# Patient Record
Sex: Female | Born: 1995
Health system: Southern US, Community
[De-identification: ages and names within clinical notes are randomized; demographics above are authoritative.]

## PROBLEM LIST (undated history)

## (undated) DIAGNOSIS — I471 Supraventricular tachycardia, unspecified: Secondary | ICD-10-CM

---
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
## 2017-05-13 DIAGNOSIS — I471 Supraventricular tachycardia: Secondary | ICD-10-CM | POA: Diagnosis not present

## 2017-06-13 DIAGNOSIS — I471 Supraventricular tachycardia: Secondary | ICD-10-CM | POA: Diagnosis not present

## 2017-06-13 NOTE — H&P (Signed)
 [facility]  [id number], [address] [facility], [place] [address] Ph     [phone]  Fax   [phone] Appt [phone]   [facility] HISTORY AND PHYSICAL  06/13/2017   Erica Summers   DOB Sep 08, 1995 MRN [id number]  Referring Physician: Cordella Rea, MD  Primary Care Physician: Jenetta Holmes HERO, MD [address] / [place] [address]  Indication for procedure: PSVT  Problem List: Problem List  Date Reviewed: 05/17/2017         Noted   PSVT (paroxysmal supraventricular tachycardia) (CMS-HCC) 02/10/2016   Heart palpitations 12/26/2015                                                                                                                                                  History of present illness: The patient is a 22 y.o.  female who is here for EP study with potential ablation.  She had documented SVT by event monitor with somewhat frequent recurrences, especially over the last couple of months. She desires potential ablation. No recent fevers or viral illness.  Review of Systems: Except for that noted above, review of the following systems is negative: general, ENT, respiratory, cardiovascular, genitourinary, gastrointestinal, musculoskeletal, neurologic, endocrine, integument, developmental.  No current Epic-ordered outpatient medications on file.   No current Epic-ordered facility-administered medications on file.     No Known Allergies  PMHx/PSHx: 1. SVT 2. No prior surgeries  Family History  Problem Relation Age of Onset  . No Known Problems Mother   . No Known Problems Father   . No Known Problems Sister   . No Known Problems Brother    Apart from that noted above, there is no history of sudden death below the age of 62, congenital heart disease, SIDS, long QT syndrome, Brugada syndrome, other arrhythmia syndrome, cardiomyopathy, heart transplant, pacemaker or ICD implantation, early coronary artery  disease.   Social history: Erica Summers lives with her parents and attends [employer]. No history of tobacco use.   Physical examination:   BP 115/71 (BP Location: Right upper arm, Patient Position: Sitting, BP Cuff Size: Adult)   Pulse 115   Temp 36.9 C (98.4 F) (Oral)   Resp 18   Ht 170 cm (5' 6.93)   Wt 58.5 kg (128 lb 15.5 oz)   SpO2 96%   BMI 20.24 kg/m   Constitutional:  In general the patient is well-developed, well-nourished, alert, interactive, and developmentally-appropriate.   HEENT: Non-dysmorphic. The oropharynx is normal. Dentition is good. Mucous membranes are moist and acyanotic. Neck is supple without adenopathy.   Respiratory: The chest is normally formed and clear to auscultation bilaterally.   Cardiovascular: There is a regular rate and rhythm. The precordial impulse is normal in intensity and location. There are no lifts or heaves. There is a normal S1 and  S2. The second heart sound splits physiologically with respirations. There are no murmurs, rubs, or gallops. Diastole is quiet. There are 2+ pulses in the upper and lower extremities without delays.   GI: The abdomen is soft and flat without hepatosplenomegaly.   Extremities: Warm and well-perfused. There is no cyanosis, clubbing, or edema.   Skin: no cyanosis, rashes, or significant lesions.   Musculoskeletal: full strength, no deformities, symmetric.   Neuro: Symmetric, interactive, and developmentally appropriate. No overt deficits.  Laboratory Studies Reviewed or Ordered:  Electrocardiogram:  The most recent 12-lead electrocardiogram was reviewed which shows the presence of normal sinus rhythm.  There is no evidence for chamber enlargement or hypertrophy.  There is a normal QRS axis for age.  Repolarization appears normal with a normal QT interval.  Echocardiogram: The report of the the most recent echocardiogram was reviewed.   Diagnostic impressions:  1. Paroxysmal SVT  Erica Summers is  here for catheter electrophysiology study with option for ablation. Risks and benefits discussed. Consent in chart.  Plan:  Catheter electrophysiology study with option for ablation GETA with recovery in PACU Overnight observation       Zebulon Zachary Spector, MD Pediatric Cardiology and Electrophysiology [facility]  [facility] Phone: [phone]  Fax: [phone] Electrophysiology Offices Phone: [phone]  Fax: [phone] [facility] Phone: [phone]  Fax: [phone] [facility] Phone: [phone]  Fax: [phone] [facility] on call: [phone] or [phone]       [facility]  [id number], [address] [facility] [place], [place] [address] Ph     [phone]  Fax   [phone] Appt [phone]   [facility] IMPACT-MAP-IT PEDIATRIC ABLATION REGISTRY  PRE-ELECTROPHYSIOLOGY STUDY SYMPTOM SEVERITY SURVEY  06/13/2017 Erica Summers 21-Sep-1995 [id number]  Date of procedure: 06/13/2017  1. In the past six months, what symptom or feeling that comes from the patient's heart problem bothers him/her the most often? Palpitations   2.  If any symptoms have been present, how often has the patient had this feeling in the past six months (skip if no symptoms)? At least once per month  3. In the past six months, what symptom or feeling that comes from the patient's heart problem was the worst (most severe or unpleasant)? Palpitations   4. In the past six months, for any heart rhythm episodes the patient has had, what is the most intense (severe) treatment that the patient has endured to treat the heart problem? Self-resolving  5. In the past six months, has the patient taken any of the following medications ? (Check all that apply) None  6. In the past six months, does the patient feel that their rhythm problem has interfered with how well they are able to work, go to school, or  play? yes   7. Indicate who completed the Symptom Severity Survey Patient

## 2017-06-14 DIAGNOSIS — I471 Supraventricular tachycardia: Secondary | ICD-10-CM | POA: Diagnosis not present

## 2017-08-05 HISTORY — PX: CARDIAC ELECTROPHYSIOLOGY MAPPING AND ABLATION: SHX1292

## 2017-08-10 ENCOUNTER — Telehealth: Payer: Self-pay | Admitting: Family Medicine

## 2017-08-10 NOTE — Telephone Encounter (Unsigned)
Copied from CRM [id number]#[id number]. Topic: Quick Communication - See Telephone Encounter >> Aug 10, 2017  4:34 PM Raquel SarnaHayes, Teresa G wrote: Pt's mother called needing advise on how to help pt.  Pt is now in college and is struggling w/ attention issues and is struggling with grades.  Please call mother back to discuss care and next steps.

## 2017-08-11 NOTE — Telephone Encounter (Signed)
Called mother.. No answer. Let a message for her to call back office  Please call her tommorow.. Let her know if ADD issues.. She can make appt here. We can refer for ADD testing  In [place]/ OR she can look into ADD testin on  [employer] [facility] then bring the info to me after testing completed and we can talk about a treatment plan.  Created CRM

## 2017-08-12 NOTE — Telephone Encounter (Signed)
Left message for Erica Summers to return call.  [facility] with mom to give recommendations from Dr. Ermalene SearingBedsole.

## 2017-09-02 ENCOUNTER — Other Ambulatory Visit: Payer: Self-pay

## 2017-09-02 ENCOUNTER — Encounter: Payer: Self-pay | Admitting: Family Medicine

## 2017-09-02 ENCOUNTER — Ambulatory Visit (INDEPENDENT_AMBULATORY_CARE_PROVIDER_SITE_OTHER): Payer: BLUE CROSS/BLUE SHIELD | Admitting: Family Medicine

## 2017-09-02 DIAGNOSIS — N926 Irregular menstruation, unspecified: Secondary | ICD-10-CM | POA: Diagnosis not present

## 2017-09-02 DIAGNOSIS — R109 Unspecified abdominal pain: Secondary | ICD-10-CM | POA: Diagnosis not present

## 2017-09-02 DIAGNOSIS — R41840 Attention and concentration deficit: Secondary | ICD-10-CM | POA: Diagnosis not present

## 2017-09-02 LAB — POC URINALSYSI DIPSTICK (AUTOMATED)
BILIRUBIN UA: NEGATIVE
Blood, UA: NEGATIVE
GLUCOSE UA: NEGATIVE
KETONES UA: NEGATIVE
Leukocytes, UA: NEGATIVE
Nitrite, UA: NEGATIVE
Protein, UA: NEGATIVE
Spec Grav, UA: 1.025 (ref 1.010–1.025)
Urobilinogen, UA: 0.2 E.U./dL
pH, UA: 6 (ref 5.0–8.0)

## 2017-09-02 LAB — POCT URINE PREGNANCY: Preg Test, Ur: POSITIVE — AB

## 2017-09-02 NOTE — Progress Notes (Signed)
   Subjective:    Patient ID: Erica Summers, female    DOB: 09-13-1995, 22 y.o.   MRN: [id number]  HPI  22 year old female presents for trouble concentrating. She has not noted in highschool, course work was easier.. Now since in college, she has struggled concentrating, more fidgity.  Notes at home as well as at school.  No new meds.  Has never been  Tested for add.  When she was a child was fidgity per parents.   She feels she many be more anxious.. She feels overwhelmed moving out.   PHQ9 7 GAD7: 6   PSVT: treated with ablation 06/2017.Marland Kitchen. No problems since.    Has noted a heavy feeling in stomach. She has noted increase in irregularity of menses. Ongoing x 3 months.  Menses occur once a month, but not on same day.. Off by a few days.  Lasts 6 days, not heavy. No dysuria. Occ cramping with BM and urinating. Relieves symptoms some what.  Occ diarrhea and constipation.  She is sexually active. No blood in urine or stool.   She feels like diet in last year, lighter meals, eating more junk.   Review of Systems  Constitutional: Negative for fatigue and fever.  HENT: Negative for ear pain.   Eyes: Negative for pain.  Respiratory: Negative for chest tightness and shortness of breath.   Cardiovascular: Negative for chest pain, palpitations and leg swelling.  Gastrointestinal: Negative for abdominal pain.  Genitourinary: Negative for dysuria.       Objective:   Physical Exam  Constitutional: Vital signs are normal. She appears well-developed and well-nourished. She is cooperative.  Non-toxic appearance. She does not appear ill. No distress.  HENT:  Head: Normocephalic.  Right Ear: Hearing, tympanic membrane, external ear and ear canal normal. Tympanic membrane is not erythematous, not retracted and not bulging.  Left Ear: Hearing, tympanic membrane, external ear and ear canal normal. Tympanic membrane is not erythematous, not retracted and not bulging.  Nose: No mucosal  edema or rhinorrhea. Right sinus exhibits no maxillary sinus tenderness and no frontal sinus tenderness. Left sinus exhibits no maxillary sinus tenderness and no frontal sinus tenderness.  Mouth/Throat: Uvula is midline, oropharynx is clear and moist and mucous membranes are normal.  Eyes: Pupils are equal, round, and reactive to light. Conjunctivae, EOM and lids are normal. Lids are everted and swept, no foreign bodies found.  Neck: Trachea normal and normal range of motion. Neck supple. Carotid bruit is not present. No thyroid mass and no thyromegaly present.  Cardiovascular: Normal rate, regular rhythm, S1 normal, S2 normal, normal heart sounds, intact distal pulses and normal pulses. Exam reveals no gallop and no friction rub.  No murmur heard. Pulmonary/Chest: Effort normal and breath sounds normal. No tachypnea. No respiratory distress. She has no decreased breath sounds. She has no wheezes. She has no rhonchi. She has no rales.  Abdominal: Soft. Normal appearance and bowel sounds are normal. There is no hepatosplenomegaly. There is tenderness in the right lower quadrant. There is no rigidity, no rebound, no guarding and no CVA tenderness.  Neurological: She is alert.  Skin: Skin is warm, dry and intact. No rash noted.  Psychiatric: Her speech is normal and behavior is normal. Judgment and thought content normal. Her mood appears not anxious. Cognition and memory are normal. She does not exhibit a depressed mood.          Assessment & Plan:

## 2017-09-02 NOTE — Assessment & Plan Note (Signed)
Refer for ADD testing. Possible GAD, mild involved.  Hesitate to use stimulant given PSVT history.. Would need to clear with cardiology.

## 2017-09-02 NOTE — Patient Instructions (Signed)
Please stop at the front desk to set up referral.  

## 2017-09-02 NOTE — Assessment & Plan Note (Signed)
Pt with positive pregnancy test. She is in shock and does not wish to have any further confirmation. Does not know what she will do. I will contact her next week to  Check on her and  To see if she has determined her plan or needs assistance.

## 2017-09-15 DIAGNOSIS — I471 Supraventricular tachycardia: Secondary | ICD-10-CM | POA: Diagnosis not present

## 2017-09-15 DIAGNOSIS — Z9889 Other specified postprocedural states: Secondary | ICD-10-CM | POA: Diagnosis not present

## 2017-09-15 DIAGNOSIS — Z8679 Personal history of other diseases of the circulatory system: Secondary | ICD-10-CM | POA: Diagnosis not present

## 2017-11-01 ENCOUNTER — Telehealth: Payer: Self-pay | Admitting: Family Medicine

## 2017-11-01 NOTE — Telephone Encounter (Signed)
Please advise, pt request to transfer from [facility] to Dr. Dayton Martes. Both providers ok to transfer?     Copied from CRM [id number]. Topic: Appointment Scheduling - Scheduling Inquiry for Clinic >> Nov 01, 2017 12:27 PM Leafy Ro wrote: Reason for CRM: pt would like to switch to dr Dayton Martes

## 2017-11-01 NOTE — Telephone Encounter (Signed)
FYI: Last OV pt found to be pregnant. She did not follow up  To tell us her plans, not sure what happened.  Not sure why transfer but no clear red flags.  She is a very nice girl.

## 2017-11-02 ENCOUNTER — Emergency Department: Payer: BLUE CROSS/BLUE SHIELD

## 2017-11-02 ENCOUNTER — Emergency Department
Admission: EM | Admit: 2017-11-02 | Discharge: 2017-11-02 | Disposition: A | Discharge status: Home / Self Care | Payer: BLUE CROSS/BLUE SHIELD | Source: Home / Self Care | Attending: Emergency Medicine | Admitting: Emergency Medicine

## 2017-11-02 ENCOUNTER — Inpatient Hospital Stay
Admission: RE | Admit: 2017-11-02 | Discharge: 2017-11-13 | DRG: 787 | Disposition: A | Discharge status: Home / Self Care | Payer: BLUE CROSS/BLUE SHIELD | Attending: Obstetrics and Gynecology | Admitting: Obstetrics and Gynecology

## 2017-11-02 ENCOUNTER — Encounter: Payer: Self-pay | Admitting: Emergency Medicine

## 2017-11-02 ENCOUNTER — Other Ambulatory Visit: Payer: Self-pay

## 2017-11-02 ENCOUNTER — Encounter: Payer: Self-pay | Admitting: *Deleted

## 2017-11-02 DIAGNOSIS — O0930 Supervision of pregnancy with insufficient antenatal care, unspecified trimester: Secondary | ICD-10-CM

## 2017-11-02 DIAGNOSIS — O1493 Unspecified pre-eclampsia, third trimester: Secondary | ICD-10-CM | POA: Diagnosis not present

## 2017-11-02 DIAGNOSIS — O1494 Unspecified pre-eclampsia, complicating childbirth: Secondary | ICD-10-CM | POA: Diagnosis not present

## 2017-11-02 DIAGNOSIS — O9081 Anemia of the puerperium: Secondary | ICD-10-CM | POA: Diagnosis not present

## 2017-11-02 DIAGNOSIS — E039 Hypothyroidism, unspecified: Secondary | ICD-10-CM | POA: Diagnosis present

## 2017-11-02 DIAGNOSIS — R188 Other ascites: Secondary | ICD-10-CM | POA: Diagnosis not present

## 2017-11-02 DIAGNOSIS — O99824 Streptococcus B carrier state complicating childbirth: Secondary | ICD-10-CM | POA: Diagnosis not present

## 2017-11-02 DIAGNOSIS — Z3A31 31 weeks gestation of pregnancy: Secondary | ICD-10-CM

## 2017-11-02 DIAGNOSIS — O1414 Severe pre-eclampsia complicating childbirth: Secondary | ICD-10-CM | POA: Diagnosis not present

## 2017-11-02 DIAGNOSIS — Z3A29 29 weeks gestation of pregnancy: Secondary | ICD-10-CM | POA: Diagnosis not present

## 2017-11-02 DIAGNOSIS — O1490 Unspecified pre-eclampsia, unspecified trimester: Secondary | ICD-10-CM | POA: Diagnosis present

## 2017-11-02 DIAGNOSIS — I10 Essential (primary) hypertension: Secondary | ICD-10-CM

## 2017-11-02 DIAGNOSIS — O0933 Supervision of pregnancy with insufficient antenatal care, third trimester: Secondary | ICD-10-CM | POA: Diagnosis not present

## 2017-11-02 DIAGNOSIS — O163 Unspecified maternal hypertension, third trimester: Secondary | ICD-10-CM | POA: Diagnosis present

## 2017-11-02 DIAGNOSIS — R600 Localized edema: Secondary | ICD-10-CM

## 2017-11-02 DIAGNOSIS — Z3689 Encounter for other specified antenatal screening: Secondary | ICD-10-CM | POA: Diagnosis not present

## 2017-11-02 DIAGNOSIS — R7989 Other specified abnormal findings of blood chemistry: Secondary | ICD-10-CM | POA: Diagnosis not present

## 2017-11-02 DIAGNOSIS — O321XX0 Maternal care for breech presentation, not applicable or unspecified: Secondary | ICD-10-CM | POA: Diagnosis not present

## 2017-11-02 DIAGNOSIS — R609 Edema, unspecified: Secondary | ICD-10-CM

## 2017-11-02 DIAGNOSIS — O99284 Endocrine, nutritional and metabolic diseases complicating childbirth: Secondary | ICD-10-CM | POA: Diagnosis present

## 2017-11-02 DIAGNOSIS — Z3493 Encounter for supervision of normal pregnancy, unspecified, third trimester: Secondary | ICD-10-CM

## 2017-11-02 DIAGNOSIS — R0602 Shortness of breath: Secondary | ICD-10-CM | POA: Insufficient documentation

## 2017-11-02 DIAGNOSIS — R030 Elevated blood-pressure reading, without diagnosis of hypertension: Secondary | ICD-10-CM | POA: Diagnosis not present

## 2017-11-02 DIAGNOSIS — O26893 Other specified pregnancy related conditions, third trimester: Secondary | ICD-10-CM | POA: Diagnosis not present

## 2017-11-02 DIAGNOSIS — O43813 Placental infarction, third trimester: Secondary | ICD-10-CM | POA: Diagnosis not present

## 2017-11-02 DIAGNOSIS — R0600 Dyspnea, unspecified: Secondary | ICD-10-CM

## 2017-11-02 DIAGNOSIS — O26853 Spotting complicating pregnancy, third trimester: Secondary | ICD-10-CM | POA: Diagnosis not present

## 2017-11-02 DIAGNOSIS — O1413 Severe pre-eclampsia, third trimester: Secondary | ICD-10-CM | POA: Diagnosis not present

## 2017-11-02 DIAGNOSIS — Z3A32 32 weeks gestation of pregnancy: Secondary | ICD-10-CM | POA: Diagnosis not present

## 2017-11-02 DIAGNOSIS — Z3A30 30 weeks gestation of pregnancy: Secondary | ICD-10-CM | POA: Diagnosis not present

## 2017-11-02 DIAGNOSIS — O133 Gestational [pregnancy-induced] hypertension without significant proteinuria, third trimester: Secondary | ICD-10-CM | POA: Diagnosis not present

## 2017-11-02 HISTORY — DX: Supraventricular tachycardia: I47.1

## 2017-11-02 HISTORY — DX: Supraventricular tachycardia, unspecified: I47.10

## 2017-11-02 LAB — CBC
HEMATOCRIT: 37.4 % (ref 35.0–47.0)
HEMOGLOBIN: 12.7 g/dL (ref 12.0–16.0)
MCH: 30.6 pg (ref 26.0–34.0)
MCHC: 34.1 g/dL (ref 32.0–36.0)
MCV: 89.9 fL (ref 80.0–100.0)
Platelets: 234 10*3/uL (ref 150–440)
RBC: 4.16 MIL/uL (ref 3.80–5.20)
RDW: 14.6 % — AB (ref 11.5–14.5)
WBC: 8.6 10*3/uL (ref 3.6–11.0)

## 2017-11-02 LAB — TROPONIN I
TROPONIN I: 0.03 ng/mL — AB (ref <0.03)
Troponin I: 0.03 ng/mL (ref <0.03)

## 2017-11-02 LAB — URINALYSIS, COMPLETE (UACMP) WITH MICROSCOPIC
BILIRUBIN URINE: NEGATIVE
Glucose, UA: NEGATIVE mg/dL
HGB URINE DIPSTICK: NEGATIVE
Ketones, ur: NEGATIVE mg/dL
LEUKOCYTES UA: NEGATIVE
Nitrite: NEGATIVE
Protein, ur: 100 mg/dL — AB
SPECIFIC GRAVITY, URINE: 1.019 (ref 1.005–1.030)
pH: 5 (ref 5.0–8.0)

## 2017-11-02 LAB — COMPREHENSIVE METABOLIC PANEL
ALK PHOS: 143 U/L — AB (ref 38–126)
ALT: 15 U/L (ref 14–54)
ANION GAP: 10 (ref 5–15)
AST: 36 U/L (ref 15–41)
Albumin: 2.3 g/dL — ABNORMAL LOW (ref 3.5–5.0)
BILIRUBIN TOTAL: 0.4 mg/dL (ref 0.3–1.2)
BUN: 28 mg/dL — ABNORMAL HIGH (ref 6–20)
CALCIUM: 8.6 mg/dL — AB (ref 8.9–10.3)
CO2: 19 mmol/L — ABNORMAL LOW (ref 22–32)
Chloride: 106 mmol/L (ref 101–111)
Creatinine, Ser: 0.95 mg/dL (ref 0.44–1.00)
GFR calc Af Amer: >60 mL/min (ref >60)
GFR calc non Af Amer: >60 mL/min (ref >60)
GLUCOSE: 78 mg/dL (ref 65–99)
POTASSIUM: 4.3 mmol/L (ref 3.5–5.1)
Sodium: 135 mmol/L (ref 135–145)
TOTAL PROTEIN: 5.9 g/dL — AB (ref 6.5–8.1)

## 2017-11-02 LAB — LIPASE, BLOOD: LIPASE: 37 U/L (ref 11–51)

## 2017-11-02 LAB — TSH: TSH: 5.22 u[IU]/mL — ABNORMAL HIGH (ref 0.350–4.500)

## 2017-11-02 LAB — POC URINE PREG, ED: Preg Test, Ur: POSITIVE — AB

## 2017-11-02 LAB — RAPID HIV SCREEN (HIV 1/2 AB+AG)
HIV 1/2 Antibodies: NONREACTIVE
HIV-1 P24 Antigen - HIV24: NONREACTIVE

## 2017-11-02 LAB — HCG, QUANTITATIVE, PREGNANCY: hCG, Beta Chain, Quant, S: 35063 m[IU]/mL — ABNORMAL HIGH (ref <5)

## 2017-11-02 LAB — ABO/RH: ABO/RH(D): O POS

## 2017-11-02 LAB — BRAIN NATRIURETIC PEPTIDE: B Natriuretic Peptide: 252 pg/mL — ABNORMAL HIGH (ref 0.0–100.0)

## 2017-11-02 MED ORDER — CALCIUM CARBONATE ANTACID 500 MG PO CHEW
2.0000 | CHEWABLE_TABLET | ORAL | Status: DC | PRN
  Administered 2017-11-08 – 2017-11-09 (×3): 400 mg via ORAL
  Filled 2017-11-02 (×4): qty 2

## 2017-11-02 MED ORDER — HYDRALAZINE HCL 20 MG/ML IJ SOLN
10.0000 mg | Freq: Once | INTRAMUSCULAR | Status: AC | PRN
  Administered 2017-11-07: 10 mg via INTRAVENOUS
  Filled 2017-11-02: qty 1

## 2017-11-02 MED ORDER — PRENATAL MULTIVITAMIN CH
1.0000 | ORAL_TABLET | Freq: Every day | ORAL | Status: DC
  Administered 2017-11-03 – 2017-11-09 (×6): 1 via ORAL
  Filled 2017-11-02 (×6): qty 1

## 2017-11-02 MED ORDER — MAGNESIUM SULFATE 4 GM/100ML IV SOLN
INTRAVENOUS | Status: AC
  Administered 2017-11-02: 4 g
  Filled 2017-11-02: qty 100

## 2017-11-02 MED ORDER — DOCUSATE SODIUM 100 MG PO CAPS
100.0000 mg | ORAL_CAPSULE | Freq: Every day | ORAL | Status: DC
  Administered 2017-11-03 – 2017-11-09 (×6): 100 mg via ORAL
  Filled 2017-11-02 (×6): qty 1

## 2017-11-02 MED ORDER — CALCIUM GLUCONATE 10 % IV SOLN
INTRAVENOUS | Status: AC
  Filled 2017-11-02: qty 10

## 2017-11-02 MED ORDER — LACTATED RINGERS IV SOLN
INTRAVENOUS | Status: DC
  Administered 2017-11-02 – 2017-11-04 (×4): via INTRAVENOUS
  Administered 2017-11-04: 1000 mL via INTRAVENOUS

## 2017-11-02 MED ORDER — ACETAMINOPHEN 325 MG PO TABS
650.0000 mg | ORAL_TABLET | ORAL | Status: DC | PRN
  Administered 2017-11-03 – 2017-11-08 (×4): 650 mg via ORAL
  Filled 2017-11-02 (×4): qty 2

## 2017-11-02 MED ORDER — LABETALOL HCL 5 MG/ML IV SOLN
20.0000 mg | INTRAVENOUS | Status: AC | PRN
  Administered 2017-11-09: 40 mg via INTRAVENOUS
  Administered 2017-11-09: 20 mg via INTRAVENOUS
  Administered 2017-11-09: 80 mg via INTRAVENOUS
  Filled 2017-11-02: qty 8
  Filled 2017-11-02: qty 4
  Filled 2017-11-02 (×2): qty 16

## 2017-11-02 MED ORDER — ZOLPIDEM TARTRATE 5 MG PO TABS
5.0000 mg | ORAL_TABLET | Freq: Every evening | ORAL | Status: DC | PRN
  Administered 2017-11-03: 5 mg via ORAL
  Filled 2017-11-02: qty 1

## 2017-11-02 MED ORDER — LABETALOL HCL 5 MG/ML IV SOLN
INTRAVENOUS | Status: AC
  Filled 2017-11-02: qty 4

## 2017-11-02 MED ORDER — MAGNESIUM SULFATE 40 G IN LACTATED RINGERS - SIMPLE
INTRAVENOUS | Status: AC
  Administered 2017-11-02: 2 g/h via INTRAVENOUS
  Filled 2017-11-02: qty 500

## 2017-11-02 MED ORDER — BETAMETHASONE SOD PHOS & ACET 6 (3-3) MG/ML IJ SUSP
INTRAMUSCULAR | Status: AC
  Administered 2017-11-02: 12 mg via INTRAMUSCULAR
  Filled 2017-11-02: qty 1

## 2017-11-02 MED ORDER — MAGNESIUM SULFATE 40 G IN LACTATED RINGERS - SIMPLE
2.0000 g/h | INTRAVENOUS | Status: AC
  Administered 2017-11-02 – 2017-11-04 (×3): 2 g/h via INTRAVENOUS
  Filled 2017-11-02 (×3): qty 500

## 2017-11-02 MED ORDER — LABETALOL HCL 100 MG PO TABS
100.0000 mg | ORAL_TABLET | Freq: Once | ORAL | Status: AC
  Administered 2017-11-02: 100 mg via ORAL
  Filled 2017-11-02: qty 1

## 2017-11-02 MED ORDER — BETAMETHASONE SOD PHOS & ACET 6 (3-3) MG/ML IJ SUSP
12.0000 mg | INTRAMUSCULAR | Status: AC
  Administered 2017-11-02 – 2017-11-03 (×2): 12 mg via INTRAMUSCULAR

## 2017-11-02 MED ORDER — MAGNESIUM SULFATE BOLUS VIA INFUSION
4.0000 g | Freq: Once | INTRAVENOUS | Status: DC
  Filled 2017-11-02: qty 500

## 2017-11-02 NOTE — Telephone Encounter (Signed)
Yes okay with me and thank you for the FYI.

## 2017-11-02 NOTE — ED Notes (Signed)
Pt back from X-ray.  

## 2017-11-02 NOTE — ED Notes (Signed)
Patient transported to Ultrasound 

## 2017-11-02 NOTE — ED Triage Notes (Signed)
Pt to ED from home c/o bilateral leg swelling started 1 week ago and getting worse, states SOB worse with sitting and lying down, no change with exertion.  Denies injury ior heavy activity, denes pain at this time.  States had ablation done at [facility] in January for SVT.  (+) pedal pulses bilaterally, no pitting with edema.

## 2017-11-02 NOTE — ED Notes (Signed)
Patient transported to X-ray 

## 2017-11-02 NOTE — ED Notes (Addendum)
Patient transported to XRAY 

## 2017-11-02 NOTE — Telephone Encounter (Signed)
Please help call and schedule transfer pt with Dr. Dayton Martes.   Thank you

## 2017-11-02 NOTE — H&P (Signed)
Obstetric History and Physical  Erica Summers is a 22 y.o. G1P0 with IUP at [redacted]w[redacted]d (by today's limited ultrasound) presenting to the Emergency for complaints of leg swelling.  Of note, patient has had no prenatal care. Notes that she did not know that she was pregnant until diagnosed in the Emergency Room. She states that the leg swelling has been ongoing for~ 1 week, but was significantly worse today. She denies SOB, chest pain, nausea/vomiting, RUQ pain, vision changes.  Blood pressures were noted to be elevated in the ER, 140s-150s/100s-110s.   Patient denies contractions, no vaginal bleeding, intact membranes, with no history of ever noting fetal movement.    Prenatal Course Source of Care: None Pregnancy complications or risks: Patient Active Problem List   Diagnosis Date Noted  . No prenatal care in current pregnancy 11/02/2017  . Pre-eclampsia affecting pregnancy, antepartum 11/02/2017  . Decreased attention Span 09/02/2017  . Abdominal cramping 09/02/2017  . Irregular menses 09/02/2017  . Fatigue 06/08/2016  . Screen for sexually transmitted diseases 06/08/2016  . PSVT (paroxysmal supraventricular tachycardia) (HCC) 02/10/2016  . Palpitations 08/11/2015  . DUB (dysfunctional uterine bleeding) 02/18/2015    Prenatal labs and studies: ABO, Rh: --/--/O POS Performed at [facility], [address] [place], [place] [address]  [phone]/29 [phone]) Antibody:   Rubella:   RPR:    HBsAg:    HIV:    GBS:  1 hr Glucola:    Past Medical History:  Diagnosis Date  . SVT (supraventricular tachycardia) (HCC)     Past Surgical History:  Procedure Laterality Date  . CARDIAC ELECTROPHYSIOLOGY MAPPING AND ABLATION  08/2017  . WISDOM TOOTH EXTRACTION  2016    OB History  Gravida Para Term Preterm AB Living  1            SAB TAB Ectopic Multiple Live Births               # Outcome Date GA Lbr Len/2nd Weight Sex Delivery Anes PTL Lv  1 Current             Social  History   Socioeconomic History  . Marital status: Single    Spouse name: Not on file  . Number of children: Not on file  . Years of education: Not on file  . Highest education level: Not on file  Occupational History  . Not on file  Social Needs  . Financial resource strain: Not on file  . Food insecurity:    Worry: Not on file    Inability: Not on file  . Transportation needs:    Medical: Not on file    Non-medical: Not on file  Tobacco Use  . Smoking status: Never Smoker  . Smokeless tobacco: Never Used  Substance and Sexual Activity  . Alcohol use: No  . Drug use: No  . Sexual activity: Yes    Birth control/protection: None  Lifestyle  . Physical activity:    Days per week: Not on file    Minutes per session: Not on file  . Stress: Not on file  Relationships  . Social connections:    Talks on phone: Not on file    Gets together: Not on file    Attends religious service: Not on file    Active member of club or organization: Not on file    Attends meetings of clubs or organizations: Not on file    Relationship status: Not on file  Other Topics Concern  .  Not on file  Social History Narrative   Going to the 6 th grade [employer]      A and B in school      Plays soccer      Goes to [employer] a lot      Unsure of career plans      Plays outside with running, bike, and swimming    History reviewed. No pertinent family history.  Medications Prior to Admission  Medication Sig Dispense Refill Last Dose  . Multiple Vitamin (MULTIVITAMIN WITH MINERALS) TABS tablet Take 1 tablet by mouth daily.       No Known Allergies  Review of Systems: Negative except for what is mentioned in HPI.  Physical Exam: Blood pressure (!) 155/116, pulse 88, temperature 98.5 F (36.9 C), temperature source Oral, resp. rate 18, height 5' 7.5" (1.715 m), weight 136 lb (61.7 kg), last menstrual period 08/22/2017.  CONSTITUTIONAL: Well-developed,  well-nourished female in no acute distress.  HENT:  Normocephalic, atraumatic, External right and left ear normal. Oropharynx is clear and moist EYES: Conjunctivae and EOM are normal. Pupils are equal, round, and reactive to light. No scleral icterus.  NECK: Normal range of motion, supple, no masses SKIN: Skin is warm and dry. No rash noted. Not diaphoretic. No erythema. No pallor. NEUROLOGIC: Alert and oriented to person, place, and time. Normal reflexes, muscle tone coordination. No cranial nerve deficit noted. +3 clonus. DTR weak.  PSYCHIATRIC: Normal mood and affect. Normal behavior. Normal judgment and thought content. CARDIOVASCULAR: Normal heart rate noted, regular rhythm.  +3 pitting edema in ankles, +1 pitting edema in legs up to mid-calf.  RESPIRATORY: Effort and breath sounds normal, no problems with respiration noted ABDOMEN: Soft, nontender, nondistended, gravid. MUSCULOSKELETAL: Normal range of motion.  2+ distal pulses.    Cervical Exam: Deferred.  Presentation: breech (based on today's limited ultrasound) FHT:  Baseline rate 130 bpm   Variability moderate  Accelerations present   Decelerations late (x2, from baseline 130s to 90s) Contractions: Irregular, q 3-8 mins (undetectable by patient)   Pertinent Labs/Studies:   Results for orders placed or performed during the hospital encounter of 11/02/17 (from the past 24 hour(s))  POC Urine Pregnancy, ED     Status: Abnormal   Collection Time: 11/02/17  2:20 PM  Result Value Ref Range   Preg Test, Ur Positive (A) Negative  hCG, quantitative, pregnancy     Status: Abnormal   Collection Time: 11/02/17  5:05 PM  Result Value Ref Range   hCG, Beta Chain, Quant, S 35,063 (H) <5 mIU/mL  CBC     Status: Abnormal   Collection Time: 11/02/17  5:06 PM  Result Value Ref Range   WBC 8.6 3.6 - 11.0 K/uL   RBC 4.16 3.80 - 5.20 MIL/uL   Hemoglobin 12.7 12.0 - 16.0 g/dL   HCT 16.1 09.6 - 04.5 %   MCV 89.9 80.0 - 100.0 fL   MCH 30.6 26.0  - 34.0 pg   MCHC 34.1 32.0 - 36.0 g/dL   RDW 40.9 (H) 81.1 - 91.4 %   Platelets 234 150 - 440 K/uL  Comprehensive metabolic panel     Status: Abnormal   Collection Time: 11/02/17  5:06 PM  Result Value Ref Range   Sodium 135 135 - 145 mmol/L   Potassium 4.3 3.5 - 5.1 mmol/L   Chloride 106 101 - 111 mmol/L   CO2 19 (L) 22 - 32 mmol/L   Glucose, Bld 78 65 -  99 mg/dL   BUN 28 (H) 6 - 20 mg/dL   Creatinine, Ser 1.61 0.44 - 1.00 mg/dL   Calcium 8.6 (L) 8.9 - 10.3 mg/dL   Total Protein 5.9 (L) 6.5 - 8.1 g/dL   Albumin 2.3 (L) 3.5 - 5.0 g/dL   AST 36 15 - 41 U/L   ALT 15 14 - 54 U/L   Alkaline Phosphatase 143 (H) 38 - 126 U/L   Total Bilirubin 0.4 0.3 - 1.2 mg/dL   GFR calc non Af Amer >60 >60 mL/min   GFR calc Af Amer >60 >60 mL/min   Anion gap 10 5 - 15  Lipase, blood     Status: None   Collection Time: 11/02/17  5:06 PM  Result Value Ref Range   Lipase 37 11 - 51 U/L  Brain natriuretic peptide     Status: Abnormal   Collection Time: 11/02/17  5:06 PM  Result Value Ref Range   B Natriuretic Peptide 252.0 (H) 0.0 - 100.0 pg/mL  Troponin I     Status: Abnormal   Collection Time: 11/02/17  5:06 PM  Result Value Ref Range   Troponin I 0.03 (HH) <0.03 ng/mL  ABO/Rh     Status: None   Collection Time: 11/02/17  7:27 PM  Result Value Ref Range   ABO/RH(D)      O POS Performed at [facility], [address] [place], [place] [address]   Urinalysis, Complete w Microscopic     Status: Abnormal   Collection Time: 11/02/17  8:47 PM  Result Value Ref Range   Color, Urine YELLOW (A) YELLOW   APPearance CLOUDY (A) CLEAR   Specific Gravity, Urine 1.019 1.005 - 1.030   pH 5.0 5.0 - 8.0   Glucose, UA NEGATIVE NEGATIVE mg/dL   Hgb urine dipstick NEGATIVE NEGATIVE   Bilirubin Urine NEGATIVE NEGATIVE   Ketones, ur NEGATIVE NEGATIVE mg/dL   Protein, ur 540 (A) NEGATIVE mg/dL   Nitrite NEGATIVE NEGATIVE   Leukocytes, UA NEGATIVE NEGATIVE   RBC / HPF 0-5 0 - 5 RBC/hpf   WBC,  UA 0-5 0 - 5 WBC/hpf   Bacteria, UA RARE (A) NONE SEEN   Squamous Epithelial / LPF 0-5 0 - 5   Mucus PRESENT    Hyaline Casts, UA PRESENT     Imaging:  US OB Limited CLINICAL DATA:  Bilateral foot swelling.  Spotting.  EXAM: LIMITED OBSTETRIC ULTRASOUND  FINDINGS: Number of Fetuses: 1  Heart Rate:  140 bpm  Movement: Yes  Presentation: Breech  Placental Location: Posterior  Previa: No  Amniotic Fluid (Subjective):  Within normal limits.  BPD: 7.9 cm 31 w  6 d  MATERNAL FINDINGS:  Cervix:  Appears closed.  Uterus/Adnexae: No abnormality visualized.  IMPRESSION: Single live IUP.  This exam is performed on an emergent basis and does not comprehensively evaluate fetal size, dating, or anatomy; follow-up complete OB US should be considered if further fetal assessment is warranted.  Electronically Signed   By: Gerome Sam III M.D   On: 11/02/2017 20:53 DG Chest 2 View CLINICAL DATA:  Short of breath bilateral leg swelling for 1 week  EXAM: CHEST - 2 VIEW  COMPARISON:  None.  FINDINGS: Normal heart size. Lungs clear. No pneumothorax. Small bilateral pleural effusions.  IMPRESSION: Small bilateral pleural effusions.  Electronically Signed   By: Jolaine Click M.D.   On: 11/02/2017 18:28 US Venous Img Lower Bilateral CLINICAL DATA:  Bilateral lower extremity edema for 1  week  EXAM: BILATERAL LOWER EXTREMITY VENOUS DUPLEX ULTRASOUND  TECHNIQUE: Doppler venous assessment of the bilateral lower extremity deep venous system was performed, including characterization of spectral flow, compressibility, and phasicity.  COMPARISON:  None.  FINDINGS: There is complete compressibility of the bilateral common femoral, femoral, and popliteal veins. Doppler analysis demonstrates respiratory phasicity and augmentation of flow with calf compression within the common femoral and femoral veins. Respiratory phasicity and augmentation is demonstrated in the  right popliteal vein. There is augmentation noted in the left popliteal vein but phasicity is somewhat diminished of unknown significance. No obvious superficial vein or calf vein thrombosis.  IMPRESSION: No evidence of DVT in the lower extremities.  Electronically Signed   By: Jolaine Click M.D.   On: 11/02/2017 15:06   Assessment : ADDILYN SATTERWHITE is a 22 y.o. G1P0 at [redacted]w[redacted]d being admitted for elevated blood pressures, rule out pre-eclampsia, no prenatal care, elevated troponin, Category II fetal tracing, breech presentation.    Plan: 1. Admit to labor and Delivery  2. Patient initiated on PO Labetalol in ER. Given dose of IV Labetolol on L&D, and started on magnesium sulfate.  Pending protein/creatinine ratio to determine pre-eclampsia vs gestational HTN.  Platelets and liver enzymes wnl. Patient currently asymptomatic. Will discontinue magnesium sulfate if patient does not have pre-eclampsia. Continue with IV anti-hypertensives for severe range BPs.  3. Administer course of antenatal steroids 4. OB labs ordered as patient has not received any prenatal care.  5. Elevated troponin (borderline), also with elevated BNP. Patient asymptomatic (no chest pain, SOB), will repeat in 6 hrs. CXR notes small bilateral pleural effusions but no evidence of cardiomyopathy.  6. Category II tracing, patient turned on left side, facemask O2 as needed, with resolution of decelerations. Continues fetal monitoring and tocometry.  Patient currently not feeling contractions. Will defer cervical check at this time.  7. Ordered complete anatomy scan.  8. Patient with edema of legs, if no pre-eclampsia, can treat with 1 time dose of Lasix.      Hildred Laser, MD [facility]

## 2017-11-02 NOTE — ED Notes (Signed)
Date and time results received: 11/02/17 1850 (use smartphrase ".now" to insert current time)  Test: troponin Critical Value: 0.03  Name of Provider Notified: Quale  Orders Received? Or Actions Taken?: Orders Received - See Orders for details

## 2017-11-02 NOTE — ED Provider Notes (Signed)
[facility] Emergency Department Provider Note   ____________________________________________   First MD Initiated Contact with Patient 11/02/17 1644     (approximate)  I have reviewed the triage vital signs and the nursing notes.   HISTORY  Chief Complaint Leg Swelling    HPI Erica Summers is a 22 y.o. female ports no notable medical history except for an ablation in January at [facility] for SVT.  She is done well afterwards.  Patient reports that she is noticed quite a bit of swelling from her knees down over the last week slowly progressing.  She also had some slight shortness of breath when she lays down at night.  No fevers or chills.  No nausea vomiting.  Denies abdominal pain.  No chest pain or trouble breathing.  No history of any blood clots.  Does not take any medications.  Not allergic to any medications.   Past Medical History:  Diagnosis Date  . SVT (supraventricular tachycardia) [facility])     Patient Active Problem List   Diagnosis Date Noted  . No prenatal care in current pregnancy 11/02/2017  . Pre-eclampsia affecting pregnancy, antepartum 11/02/2017  . Elevated blood pressure affecting pregnancy in third trimester, antepartum 11/02/2017  . Decreased attention Span 09/02/2017  . Abdominal cramping 09/02/2017  . Irregular menses 09/02/2017  . Fatigue 06/08/2016  . Screen for sexually transmitted diseases 06/08/2016  . PSVT (paroxysmal supraventricular tachycardia) (HCC) 02/10/2016  . Palpitations 08/11/2015  . DUB (dysfunctional uterine bleeding) 02/18/2015    Past Surgical History:  Procedure Laterality Date  . CARDIAC ELECTROPHYSIOLOGY MAPPING AND ABLATION  08/2017  . WISDOM TOOTH EXTRACTION  2016    Prior to Admission medications   Medication Sig Start Date End Date Taking? Authorizing Provider  Multiple Vitamin (MULTIVITAMIN WITH MINERALS) TABS tablet Take 1 tablet by mouth daily.   Yes [provider]     Allergies Patient has no known allergies.  History reviewed. No pertinent family history.  Social History Social History   Tobacco Use  . Smoking status: Never Smoker  . Smokeless tobacco: Never Used  Substance Use Topics  . Alcohol use: No  . Drug use: No    Review of Systems Constitutional: No fever/chills Eyes: No visual changes.  No visual changes. ENT: No sore throat. Cardiovascular: Denies chest pain. Respiratory: Denies shortness of breath when she lays down at night. Gastrointestinal: No abdominal pain.  No nausea, no vomiting.  No diarrhea.  No constipation. Genitourinary: Negative for dysuria. Musculoskeletal: Negative for back pain.  See HPI regarding swelling. Skin: Negative for rash. Neurological: Negative for headaches, focal weakness or numbness.    ____________________________________________   PHYSICAL EXAM:  VITAL SIGNS: ED Triage Vitals  Enc Vitals Group     BP 11/02/17 1408 (!) 158/107     Pulse Rate 11/02/17 1408 99     Resp 11/02/17 1408 14     Temp 11/02/17 1408 98.4 F (36.9 C)     Temp Source 11/02/17 1408 Oral     SpO2 11/02/17 1408 100 %     Weight 11/02/17 1409 135 lb (61.2 kg)     Height 11/02/17 1409  (1.702 m)     Head Circumference --      Peak Flow --      Pain Score 11/02/17 1409 0     Pain Loc --      Pain Edu? --      Excl. in GC? --     Constitutional:  Alert and oriented. Well appearing and in no acute distress.  Some moderate hypertension is noted by me on her vital signs. Eyes: Conjunctivae are normal. Head: Atraumatic. Nose: No congestion/rhinnorhea. Mouth/Throat: Mucous membranes are moist. Neck: No stridor.   Cardiovascular: Normal rate, regular rhythm. Grossly normal heart sounds.  Good peripheral circulation. Respiratory: Normal respiratory effort.  No retractions. Lungs CTAB. Gastrointestinal: Soft and nontender. No distention. Musculoskeletal: No lower extremity tenderness but has about 3+ pitting  lower extremity edema bilateral.  Strong dorsalis pedis pulses. Neurologic:  Normal speech and language. No gross focal neurologic deficits are appreciated.  Skin:  Skin is warm, dry and intact. No rash noted. Psychiatric: Mood and affect are normal. Speech and behavior are normal.  ____________________________________________   LABS (all labs ordered are listed, but only abnormal results are displayed)  Labs Reviewed  HCG, QUANTITATIVE, PREGNANCY - Abnormal; Notable for the following components:      Result Value   hCG, Beta Chain, Quant, S 35,063 (*)    All other components within normal limits  CBC - Abnormal; Notable for the following components:   RDW 14.6 (*)    All other components within normal limits  COMPREHENSIVE METABOLIC PANEL - Abnormal; Notable for the following components:   CO2 19 (*)    BUN 28 (*)    Calcium 8.6 (*)    Total Protein 5.9 (*)    Albumin 2.3 (*)    Alkaline Phosphatase 143 (*)    All other components within normal limits  BRAIN NATRIURETIC PEPTIDE - Abnormal; Notable for the following components:   B Natriuretic Peptide 252.0 (*)    All other components within normal limits  TROPONIN I - Abnormal; Notable for the following components:   Troponin I 0.03 (*)    All other components within normal limits  URINALYSIS, COMPLETE (UACMP) WITH MICROSCOPIC - Abnormal; Notable for the following components:   Color, Urine YELLOW (*)    APPearance CLOUDY (*)    Protein, ur 100 (*)    Bacteria, UA RARE (*)    All other components within normal limits  POC URINE PREG, ED - Abnormal; Notable for the following components:   Preg Test, Ur Positive (*)    All other components within normal limits  LIPASE, BLOOD  ABO/RH   ____________________________________________  EKG  Reviewed by me at 1750 Heart rate 99 QRS 90 QTc 440 Normal sinus rhythm, no evidence of acute ischemia.  Possible slight incomplete right bundle branch  block. ____________________________________________  RADIOLOGY   Ultrasound imaging reviewed as above.  Notable for no DVTs.  Surprisingly, intrauterine pregnancy measuring 31 weeks and 6 days. ____________________________________________   PROCEDURES  Procedure(s) performed: None  Procedures  Critical Care performed: No  ____________________________________________   INITIAL IMPRESSION / ASSESSMENT AND PLAN / ED COURSE  Pertinent labs & imaging results that were available during my care of the patient were reviewed by me and considered in my medical decision making (see chart for details).  Patient presents for evaluation of lower extremity edema.  Initially, patient was unaware of her pregnancy status.  She did not know she was pregnant.  In the setting of pregnancy, and her evaluation today there is no evidence of DVT.  However given that she is unexpectedly about [redacted] weeks pregnant for which she thouggt her last menstrual cycle would only put her at about 11 weeks, but clearly well beyond that.  No DVT.  No pleuritic chest pain.  No signs or symptoms of pulmonary  embolism.  Considerations include, potential cardiomyopathy given her small pleural effusions elevated BNP and lower extremity edema.  However, after reviewing the patient's dates and her third trimester pregnancy that appears likely she has potential preeclampsia or hypertension in pregnancy, though I would favor possibly preeclampsia.  No signs of help syndrome.  EKG is reassuring, minimally elevated troponin is unusual in this patient's age group and suggest there may be some increased myocardial demand without evidence of acute ischemic changes on EKG.  He had a previous normal echo at [facility] this year.  She does have a history of SVT but this is been since ablated.  She has no associated abdominal pain.  No cramps or contractions.  No vaginal spotting or loss of fluid.  Discussed with the patient, she notified  her mother of the pregnancy and was unaware herself until today she reports.  Review of records indicates that she had a positive pregnancy test a couple months ago in clinic, but she appears to be completely unaware of this.  Case and care discussed with Dr. Valentino Saxon, patient will be moved to her service at labor and delivery floor.  Patient and her mother agreeable with plan for admission.      ____________________________________________   FINAL CLINICAL IMPRESSION(S) / ED DIAGNOSES  Final diagnoses:  Hypertension, unspecified type  Third trimester pregnancy  Peripheral edema      NEW MEDICATIONS STARTED DURING THIS VISIT:  Discharge Medication List as of 11/02/2017  9:01 PM       Note:  This document was prepared using Dragon voice recognition software and may include unintentional dictation errors.     Sharyn Creamer, MD 11/03/17 [phone]

## 2017-11-03 ENCOUNTER — Inpatient Hospital Stay: Payer: BLUE CROSS/BLUE SHIELD

## 2017-11-03 DIAGNOSIS — Z3A29 29 weeks gestation of pregnancy: Secondary | ICD-10-CM

## 2017-11-03 DIAGNOSIS — R7989 Other specified abnormal findings of blood chemistry: Secondary | ICD-10-CM

## 2017-11-03 DIAGNOSIS — O1413 Severe pre-eclampsia, third trimester: Secondary | ICD-10-CM

## 2017-11-03 LAB — CBC
HCT: 34.6 % — ABNORMAL LOW (ref 35.0–47.0)
HCT: 33.8 % — ABNORMAL LOW (ref 35.0–47.0)
HEMOGLOBIN: 11.6 g/dL — AB (ref 12.0–16.0)
Hemoglobin: 11.5 g/dL — ABNORMAL LOW (ref 12.0–16.0)
MCH: 30.3 pg (ref 26.0–34.0)
MCH: 30.6 pg (ref 26.0–34.0)
MCHC: 33.6 g/dL (ref 32.0–36.0)
MCHC: 34 g/dL (ref 32.0–36.0)
MCV: 90.2 fL (ref 80.0–100.0)
MCV: 89.8 fL (ref 80.0–100.0)
PLATELETS: 254 10*3/uL (ref 150–440)
Platelets: 243 10*3/uL (ref 150–440)
RBC: 3.83 MIL/uL (ref 3.80–5.20)
RBC: 3.76 MIL/uL — ABNORMAL LOW (ref 3.80–5.20)
RDW: 15 % — AB (ref 11.5–14.5)
RDW: 14.8 % — AB (ref 11.5–14.5)
WBC: 7.7 10*3/uL (ref 3.6–11.0)
WBC: 7.3 10*3/uL (ref 3.6–11.0)

## 2017-11-03 LAB — URINE DRUG SCREEN, QUALITATIVE (ARMC ONLY)
Amphetamines, Ur Screen: NOT DETECTED
Barbiturates, Ur Screen: NOT DETECTED
Benzodiazepine, Ur Scrn: NOT DETECTED
COCAINE METABOLITE, UR ~~LOC~~: NOT DETECTED
Cannabinoid 50 Ng, Ur ~~LOC~~: NOT DETECTED
MDMA (Ecstasy)Ur Screen: NOT DETECTED
METHADONE SCREEN, URINE: NOT DETECTED
OPIATE, UR SCREEN: NOT DETECTED
PHENCYCLIDINE (PCP) UR S: NOT DETECTED
Tricyclic, Ur Screen: NOT DETECTED

## 2017-11-03 LAB — COMPREHENSIVE METABOLIC PANEL
ALBUMIN: 2.2 g/dL — AB (ref 3.5–5.0)
ALK PHOS: 143 U/L — AB (ref 38–126)
ALT: 16 U/L (ref 14–54)
ALT: 15 U/L (ref 14–54)
ANION GAP: 8 (ref 5–15)
ANION GAP: 9 (ref 5–15)
AST: 34 U/L (ref 15–41)
AST: 40 U/L (ref 15–41)
Albumin: 2.2 g/dL — ABNORMAL LOW (ref 3.5–5.0)
Alkaline Phosphatase: 123 U/L (ref 38–126)
BILIRUBIN TOTAL: 0.4 mg/dL (ref 0.3–1.2)
BUN: 26 mg/dL — ABNORMAL HIGH (ref 6–20)
BUN: 28 mg/dL — ABNORMAL HIGH (ref 6–20)
CHLORIDE: 102 mmol/L (ref 101–111)
CO2: 19 mmol/L — AB (ref 22–32)
CO2: 19 mmol/L — AB (ref 22–32)
Calcium: 7.7 mg/dL — ABNORMAL LOW (ref 8.9–10.3)
Calcium: 7.3 mg/dL — ABNORMAL LOW (ref 8.9–10.3)
Chloride: 105 mmol/L (ref 101–111)
Creatinine, Ser: 0.83 mg/dL (ref 0.44–1.00)
Creatinine, Ser: 0.84 mg/dL (ref 0.44–1.00)
GFR calc Af Amer: >60 mL/min (ref >60)
GFR calc non Af Amer: >60 mL/min (ref >60)
GFR calc non Af Amer: >60 mL/min (ref >60)
GLUCOSE: 146 mg/dL — AB (ref 65–99)
Glucose, Bld: 117 mg/dL — ABNORMAL HIGH (ref 65–99)
POTASSIUM: 4.6 mmol/L (ref 3.5–5.1)
POTASSIUM: 4.4 mmol/L (ref 3.5–5.1)
SODIUM: 132 mmol/L — AB (ref 135–145)
SODIUM: 130 mmol/L — AB (ref 135–145)
TOTAL PROTEIN: 5.4 g/dL — AB (ref 6.5–8.1)
TOTAL PROTEIN: 5.5 g/dL — AB (ref 6.5–8.1)
Total Bilirubin: 0.4 mg/dL (ref 0.3–1.2)

## 2017-11-03 LAB — TYPE AND SCREEN
ABO/RH(D): O POS
ANTIBODY SCREEN: NEGATIVE

## 2017-11-03 LAB — CHLAMYDIA/NGC RT PCR (ARMC ONLY)
Chlamydia Tr: NOT DETECTED
N gonorrhoeae: NOT DETECTED

## 2017-11-03 LAB — MAGNESIUM
MAGNESIUM: 5.8 mg/dL — AB (ref 1.7–2.4)
Magnesium: 6.8 mg/dL (ref 1.7–2.4)

## 2017-11-03 LAB — URIC ACID
Uric Acid, Serum: 7.1 mg/dL — ABNORMAL HIGH (ref 2.3–6.6)
Uric Acid, Serum: 7.2 mg/dL — ABNORMAL HIGH (ref 2.3–6.6)

## 2017-11-03 LAB — PROTEIN / CREATININE RATIO, URINE
CREATININE, URINE: 150 mg/dL
PROTEIN CREATININE RATIO: 5.07 mg/mg{creat} — AB (ref 0.00–0.15)
TOTAL PROTEIN, URINE: 760 mg/dL

## 2017-11-03 LAB — TROPONIN I

## 2017-11-03 NOTE — Progress Notes (Signed)
   11/03/17 1945  Clinical Encounter Type  Visited With Patient and family together  Visit Type Initial;Spiritual support  Referral From Nurse  Consult/Referral To Chaplain  Spiritual Encounters  Spiritual Needs Prayer;Emotional   CH reported to PT's RM after receiving an OR. CH found PT and her mother to be in good spirits and excited about the news of PT's pregnancy. CH prayed for PT and will follow up as needed.

## 2017-11-03 NOTE — Progress Notes (Signed)
Pt taken to [place] at 0120. Returned to room and monitors placed at 0244

## 2017-11-03 NOTE — Progress Notes (Signed)
Antenatal Progress Note  Subjective:     Patient ID: Erica Summers is a 22 y.o. female [redacted]w[redacted]d, Estimated Date of Delivery: 12/29/17 by yesterday's ultrasound.  Patient's last menstrual period was 08/22/2017, inconsistent with ultrasound.  She was admitted for severe pre-eclampsia, no prenatal care.  HD# 2.   Subjective:  Patient resting comfortably. Had a headache earlier resolved with caffeine and Tylenol.   Review of Systems Denies contractions, leakage of fluids, vaginal bleeding, and still  Unable to really detect fetal movement.     Objective:   Vitals:   11/03/17 1330 11/03/17 1430 11/03/17 1531 11/03/17 1630  BP: 130/78 136/84 131/84 125/75  Pulse: 87 86 91 92  Resp: Temp:   97.7 F (36.5 C)   TempSrc:   Oral   SpO2: 100% 100%  96%  Weight:      Height:        I/O last 3 completed shifts: In: 987.1 [I.V.:887.1; Other:100] Out: 110 [Urine:110] Total I/O In: 1269.2 [I.V.:1229.2; Other:40] Out: 436 [Urine:436]   General appearance: alert and no distress Lungs: clear to auscultation bilaterally Heart: regular rate and rhythm, S1, S2 normal, no murmur, click, rub or gallop Abdomen: soft, non-tender; bowel sounds normal; no masses,  no organomegaly. Gravid Pelvic: deferred Extremities: extremities with +3 pitting edema in feet, +2 pitting edema in legs up to calves. No cyanosis.  Neurologic: + brisk clonus (L>R, but improved since earlier today). + DTR.    FHT: baseline 130 bpm, accels present.  Decels had late deceleration at ~ 2 pm.  Variability: minimal with periods of moderate Toco: no contractions    Labs:   O+/-/HIV-/RPR-.  Hepatitis, Rubella, and Varicella pending. UDS negative.    CBC Latest Ref Rng & Units 11/03/2017 11/02/2017 06/08/2016  WBC 3.6 - 11.0 K/uL 7.7 8.6 6.9  Hemoglobin 12.0 - 16.0 g/dL 11.6(L) 12.7 13.2  Hematocrit 35.0 - 47.0 % 34.6(L) 37.4 38.8  Platelets 150 - 440 K/uL 243 234 280.0    Lab Results  Component Value  Date   CREATININE 0.83 11/03/2017   BUN 26 (H) 11/03/2017   NA 132 (L) 11/03/2017   K 4.6 11/03/2017   CL 105 11/03/2017   CO2 19 (L) 11/03/2017   Lab Results  Component Value Date   ALT 16 11/03/2017   AST 34 11/03/2017   ALKPHOS 143 (H) 11/03/2017   BILITOT 0.4 11/03/2017    Lab Results  Component Value Date   LABURIC 7.1 (H) 11/03/2017     Lab Results  Component Value Date   TROPONINI <0.03 11/03/2017     Assessment:  22 y.o. female [redacted]w[redacted]d, Estimated Date of Delivery: 12/29/17 with:    1.  Pre-eclampsia with severe features 2. No prenatal care 3. H/o SVT, s/p radiofrequency ablation in January 2019. 4. Abnormal area of placentation 5. Elevated troponin 6. Elevated TSH  Plan:   1. Continue magnesium sulfate for severe pre-eclampsia for total of 48 hrs until 24 hrs after 2nd dose of antenatal steroids given. Continue to monitor strict I/O.  2. Has received first dose of antenatal steroids at 9 pm yesterday. Second dose due today.  3. Neonatology consult for preterm delivery.  4. MFM consult.  Briefly discussed case with Dr. Lyda Kalata, [facility].  Notes that patient has option of being transferred to [facility], or can remain at [facility] as long as Neonatology is on board.  Patient if stable can continue the pregnancy until [redacted] weeks gestation.  At  that time can deliver.  To deliver sooner for any fetal/maternal indications. They will come to officially consult later today.  5. H/o SVT.  Patient has been asymptomatic.  Will try to limit cardiac stimulating medications if possible.  6. Will have MFM to re-evaluate placenta due to hypoechoic lesion noted on recent anatomy scan.  Overall anatomy appears normal, growth 19%ile.   7. Elevated troponin levels,now back to normal.  Patient remains asymptomatic, denies chest pain, SOB. No evidence of cardiac enlargement on recent CXR.  Has had Echo within the past several months due to having recent heart surgery that was also normal.  May be  affects of pre-eclampsia. Continue to monitor.  8. Elevated TSH. Patient with no prior h/o thyroid disease.  Pending full panel.

## 2017-11-03 NOTE — Progress Notes (Addendum)
Antenatal Progress Note  Subjective:     Patient ID: Erica Summers is a 22 y.o. female [redacted]w[redacted]d, Estimated Date of Delivery: 12/29/17 by yesterday's ultrasound.  Patient's last menstrual period was 08/22/2017, inconsistent with ultrasound.  She was admitted for severe pre-eclampsia, no prenatal care.  HD# 2.   Subjective:  Patient denies complaints today. Just feels tired.   Review of Systems Denies contractions, leakage of fluids, vaginal bleeding, and still  Unable to really detect fetal movement.     Objective:   Vitals:   11/03/17 0710 11/03/17 0715 11/03/17 0720 11/03/17 0733  BP:    124/69  Pulse:    87  Resp:    16  Temp:    98.4 F (36.9 C)  TempSrc:    Oral  SpO2: 96% 96% 98%   Weight:      Height:       General appearance: alert and no distress Lungs: clear to auscultation bilaterally Heart: regular rate and rhythm, S1, S2 normal, no murmur, click, rub or gallop Abdomen: soft, non-tender; bowel sounds normal; no masses,  no organomegaly. Gravid Pelvic: deferred Extremities: extremities with +3 pitting edema in feet, +2 pitting edema in legs up to calves. No cyanosis.  Neurologic: + brisk clonus (L>R). + DTR.    FHT: baseline 135 bpm, accels present.  Decels none.  Variability: periods of minimal to moderate Toco: infrequent contractions      Labs:   O+/-/HIV-/RPR-.  Hepatitis, Rubella, and Varicella pending. UDS negative.    CBC Latest Ref Rng & Units 11/03/2017 11/02/2017 06/08/2016  WBC 3.6 - 11.0 K/uL 7.7 8.6 6.9  Hemoglobin 12.0 - 16.0 g/dL 11.6(L) 12.7 13.2  Hematocrit 35.0 - 47.0 % 34.6(L) 37.4 38.8  Platelets 150 - 440 K/uL 243 234 280.0    Lab Results  Component Value Date   CREATININE 0.83 11/03/2017   BUN 26 (H) 11/03/2017   NA 132 (L) 11/03/2017   K 4.6 11/03/2017   CL 105 11/03/2017   CO2 19 (L) 11/03/2017   Lab Results  Component Value Date   ALT 16 11/03/2017   AST 34 11/03/2017   ALKPHOS 143 (H)  11/03/2017   BILITOT 0.4 11/03/2017    Lab Results  Component Value Date   LABURIC 7.1 (H) 11/03/2017     Lab Results  Component Value Date   TROPONINI 0.03 (HH) 11/02/2017     Imaging:   US OB Comp + 14 Wk CLINICAL DATA:  Pregnancy.  Fetal evaluation.  EXAM: OBSTETRICAL ULTRASOUND >14 WKS  FINDINGS: Number of Fetuses: 1  Heart Rate:  120 bpm  Movement: Present  Presentation: Breech  Previa: No  Placental Location: Posterior  Amniotic Fluid (Subjective): Normal  Amniotic Fluid (Objective):  AFI 10.3 cm (5%ile= 8.8 cm, 95%= 14.4 cm for 31 wks)  FETAL BIOMETRY  BPD:  7.7cm 31w 0d  HC:    29.2cm 32w 0d  AC:   26.8cm 31w 0d  FL:   6.0cm 31w 1d  Current Mean GA: 31w 3d [place] EDC: 01/02/2018  Estimated Fetal Weight:  1,708g 19%ile  FETAL ANATOMY  Lateral Ventricles: Not visualized  Thalami/CSP: Visualized  Posterior Fossa:  Visualized  Nuchal Region: Not visualized  Upper Lip: Visualized  Spine: Visualized  4 Chamber Heart on Left: Visualized  LVOT: Visualized  RVOT: Visualized  Stomach on Left: Visualized  3 Vessel Cord: Visualized  Cord Insertion site: Not visualized  Kidneys: Visualized  Bladder: Visualized  Extremities: Visualized  Technically difficult due to:  Advanced gestational age.  Maternal Findings:  Cervix: 3.7 cm and closed. A 3.5 x 1.8 x 3.6 cm hypoechoic focus is noted in the placenta, to the left of midline. No prominent Doppler flow demonstrated. This could represent a placental tumor such as chorio angioma, hematoma, and or other etiologies of mass lesions. Molar disease cannot be completely excluded.  IMPRESSION: 1. Single viable intrauterine pregnancy at 31 weeks 3 days in breech presentation. Fetal heart rate is 120 beats per minute. Clinical evaluation is suggested.  2.  3.5 x 1.8 x 3.6 cm focal hypoechoic placental lesion as above.  These results will be called to the ordering clinician  or representative by the Radiologist Assistant, and communication documented in the PACS or zVision Dashboard.  Electronically Signed   By: Maisie Fus  Register   On: 11/03/2017 09:37    Assessment:  22 y.o. female [redacted]w[redacted]d, Estimated Date of Delivery: 12/29/17 with:    1.  Pre-eclampsia with severe features 2. No prenatal care 3. H/o SVT, s/p radiofrequency ablation in January 2019. 4. Abnormal area of placentation 5. Elevated troponin 6. Elevated TSH  Plan:   1. Continue magnesium sulfate for severe pre-eclampsia for total of 48 hrs until 2nd dose of antenatal steroids given. Continue to monitor strict I/O.  2. Has received first dose of antenatal steroids at 9 pm yesterday. Second dose due today.  3. Neonatology consult for preterm delivery.  4. MFM consult.  Briefly discussed case with Dr. Lyda Kalata, [facility].  Notes that patient has option of being transferred to [facility], or can remain at [facility] as long as Neonatology is on board.  Patient if stable can continue the pregnancy until [redacted] weeks gestation.  At that time can deliver.  To deliver sooner for any fetal/maternal indications. They will come to officially consult later today.  5. H/o SVT.  Patient has been asymptomatic.  Will try to limit cardiac stimulating medications if possible.  6. Will have MFM to re-evaluate placenta due to hypoechoic lesion noted on recent anatomy scan.  Overall anatomy appears normal, growth 19%ile.   7. Elevated troponin levels, patient currently asymptomatic, denies chest pain, SOB. No evidence of cardiac enlargement on recent CXR.  Has had Echo within the past several months due to having recent heart surgery that was also normal.  May be affects of pre-eclampsia. Continue to monitor. Next troponin level due now.  8. Elevated TSH. Patient with no prior h/o thyroid disease.  Will order full panel.

## 2017-11-03 NOTE — Progress Notes (Signed)
Discussed assessment findings and plan of care. Updated on fetal strip and maternal output. Continue Mag for 24 hrs past second dose of betamethasone per MD.

## 2017-11-03 NOTE — Progress Notes (Addendum)
0003 5/30 Pt transported to [place]; upon arrival at [place] emergent case from ED took precedence. Pt transported back to LDR 5, and placed back on monitors at 0021. Will attempt again later in the shift.

## 2017-11-04 DIAGNOSIS — O1413 Severe pre-eclampsia, third trimester: Secondary | ICD-10-CM

## 2017-11-04 LAB — COMPREHENSIVE METABOLIC PANEL
ALBUMIN: 2.1 g/dL — AB (ref 3.5–5.0)
ALT: 14 U/L (ref 14–54)
ALT: 15 U/L (ref 14–54)
AST: 30 U/L (ref 15–41)
AST: 34 U/L (ref 15–41)
Albumin: 2 g/dL — ABNORMAL LOW (ref 3.5–5.0)
Alkaline Phosphatase: 133 U/L — ABNORMAL HIGH (ref 38–126)
Alkaline Phosphatase: 141 U/L — ABNORMAL HIGH (ref 38–126)
Anion gap: 8 (ref 5–15)
Anion gap: 9 (ref 5–15)
BILIRUBIN TOTAL: 0.2 mg/dL — AB (ref 0.3–1.2)
BUN: 26 mg/dL — AB (ref 6–20)
BUN: 26 mg/dL — AB (ref 6–20)
CHLORIDE: 105 mmol/L (ref 101–111)
CO2: 20 mmol/L — AB (ref 22–32)
CO2: 20 mmol/L — ABNORMAL LOW (ref 22–32)
CREATININE: 0.81 mg/dL (ref 0.44–1.00)
CREATININE: 0.82 mg/dL (ref 0.44–1.00)
Calcium: 7 mg/dL — ABNORMAL LOW (ref 8.9–10.3)
Calcium: 6.7 mg/dL — ABNORMAL LOW (ref 8.9–10.3)
Chloride: 105 mmol/L (ref 101–111)
GFR calc Af Amer: >60 mL/min (ref >60)
GFR calc non Af Amer: >60 mL/min (ref >60)
GLUCOSE: 116 mg/dL — AB (ref 65–99)
Glucose, Bld: 106 mg/dL — ABNORMAL HIGH (ref 65–99)
POTASSIUM: 4.3 mmol/L (ref 3.5–5.1)
Potassium: 4.7 mmol/L (ref 3.5–5.1)
SODIUM: 133 mmol/L — AB (ref 135–145)
Sodium: 134 mmol/L — ABNORMAL LOW (ref 135–145)
TOTAL PROTEIN: 5.3 g/dL — AB (ref 6.5–8.1)
Total Bilirubin: 0.4 mg/dL (ref 0.3–1.2)
Total Protein: 5.1 g/dL — ABNORMAL LOW (ref 6.5–8.1)

## 2017-11-04 LAB — THYROID PANEL
FREE THYROXINE INDEX: 1.4 (ref 1.2–4.9)
Free Thyroxine Index: 1.5 (ref 1.2–4.9)
T3 Uptake Ratio: 15 % — ABNORMAL LOW (ref 24–39)
T3 Uptake Ratio: 15 % — ABNORMAL LOW (ref 24–39)
T4, Total: 9.8 ug/dL (ref 4.5–12.0)
T4, Total: 9.4 ug/dL (ref 4.5–12.0)

## 2017-11-04 LAB — HEPATITIS B SURFACE ANTIGEN: HEP B S AG: NEGATIVE

## 2017-11-04 LAB — MAGNESIUM
MAGNESIUM: 6.7 mg/dL — AB (ref 1.7–2.4)
Magnesium: 6.5 mg/dL (ref 1.7–2.4)

## 2017-11-04 LAB — CBC
HEMATOCRIT: 33 % — AB (ref 35.0–47.0)
HEMATOCRIT: 34.9 % — AB (ref 35.0–47.0)
HEMOGLOBIN: 11.1 g/dL — AB (ref 12.0–16.0)
Hemoglobin: 11.8 g/dL — ABNORMAL LOW (ref 12.0–16.0)
MCH: 30.5 pg (ref 26.0–34.0)
MCH: 30.5 pg (ref 26.0–34.0)
MCHC: 33.6 g/dL (ref 32.0–36.0)
MCHC: 33.7 g/dL (ref 32.0–36.0)
MCV: 90.7 fL (ref 80.0–100.0)
MCV: 90.6 fL (ref 80.0–100.0)
PLATELETS: 264 10*3/uL (ref 150–440)
Platelets: 251 10*3/uL (ref 150–440)
RBC: 3.64 MIL/uL — AB (ref 3.80–5.20)
RBC: 3.85 MIL/uL (ref 3.80–5.20)
RDW: 15.1 % — AB (ref 11.5–14.5)
RDW: 15.1 % — AB (ref 11.5–14.5)
WBC: 8 10*3/uL (ref 3.6–11.0)
WBC: 9.2 10*3/uL (ref 3.6–11.0)

## 2017-11-04 LAB — RUBELLA SCREEN: Rubella: <0.9 index — ABNORMAL LOW (ref >0.99)

## 2017-11-04 LAB — URINE CULTURE: Culture: <10000 — AB

## 2017-11-04 LAB — CULTURE, BETA STREP (GROUP B ONLY)

## 2017-11-04 LAB — VARICELLA ZOSTER ANTIBODY, IGG

## 2017-11-04 LAB — URIC ACID
URIC ACID, SERUM: 7.1 mg/dL — AB (ref 2.3–6.6)
Uric Acid, Serum: 7.3 mg/dL — ABNORMAL HIGH (ref 2.3–6.6)

## 2017-11-04 LAB — RPR: RPR: NONREACTIVE

## 2017-11-04 NOTE — Progress Notes (Signed)
Patient ID: Ree ShayKathryn A Summers, female   DOB: 1996-01-05, 22 y.o.   MRN: [id number]      Subjective:    Patient feels well.  Denies contractions.  Denies headache blurred vision abdominal pain.  Objective:    Patient Vitals for the past 2 hrs:  BP Pulse  11/04/17 1334 (!) 151/101 95  11/04/17 1331 (!) 145/106 97   Total I/O In: 887.5 [I.V.:887.5] Out: 820 [Urine:820]  Labs: Results for orders placed or performed during the hospital encounter of 11/02/17 (from the past 24 hour(s))  CBC     Status: Abnormal   Collection Time: 11/03/17  6:08 PM  Result Value Ref Range   WBC 7.3 3.6 - 11.0 K/uL   RBC 3.76 (L) 3.80 - 5.20 MIL/uL   Hemoglobin 11.5 (L) 12.0 - 16.0 g/dL   HCT 40.933.8 (L) 81.135.0 - 91.447.0 %   MCV 89.8 80.0 - 100.0 fL   MCH 30.6 26.0 - 34.0 pg   MCHC 34.0 32.0 - 36.0 g/dL   RDW 78.214.8 (H) 95.611.5 - 21.314.5 %   Platelets 254 150 - 440 K/uL  Comprehensive metabolic panel     Status: Abnormal   Collection Time: 11/03/17  6:08 PM  Result Value Ref Range   Sodium 130 (L) 135 - 145 mmol/L   Potassium 4.4 3.5 - 5.1 mmol/L   Chloride 102 101 - 111 mmol/L   CO2 19 (L) 22 - 32 mmol/L   Glucose, Bld 146 (H) 65 - 99 mg/dL   BUN 28 (H) 6 - 20 mg/dL   Creatinine, Ser 0.860.84 0.44 - 1.00 mg/dL   Calcium 7.3 (L) 8.9 - 10.3 mg/dL   Total Protein 5.5 (L) 6.5 - 8.1 g/dL   Albumin 2.2 (L) 3.5 - 5.0 g/dL   AST 40 15 - 41 U/L   ALT 15 14 - 54 U/L   Alkaline Phosphatase 123 38 - 126 U/L   Total Bilirubin 0.4 0.3 - 1.2 mg/dL   GFR calc non Af Amer >60 >60 mL/min   GFR calc Af Amer >60 >60 mL/min   Anion gap 9 5 - 15  Uric acid     Status: Abnormal   Collection Time: 11/03/17  6:08 PM  Result Value Ref Range   Uric Acid, Serum 7.2 (H) 2.3 - 6.6 mg/dL  Magnesium     Status: Abnormal   Collection Time: 11/03/17  6:08 PM  Result Value Ref Range   Magnesium 6.8 (HH) 1.7 - 2.4 mg/dL  Thyroid Panel     Status: Abnormal   Collection Time: 11/03/17  6:08 PM  Result Value Ref Range   T4, Total 9.4  4.5 - 12.0 ug/dL   T3 Uptake Ratio 15 (L) 24 - 39 %   Free Thyroxine Index 1.4 1.2 - 4.9  CBC     Status: Abnormal   Collection Time: 11/04/17  5:02 AM  Result Value Ref Range   WBC 8.0 3.6 - 11.0 K/uL   RBC 3.64 (L) 3.80 - 5.20 MIL/uL   Hemoglobin 11.1 (L) 12.0 - 16.0 g/dL   HCT 57.833.0 (L) 46.935.0 - 62.947.0 %   MCV 90.7 80.0 - 100.0 fL   MCH 30.5 26.0 - 34.0 pg   MCHC 33.6 32.0 - 36.0 g/dL   RDW 52.815.1 (H) 41.311.5 - 24.414.5 %   Platelets 251 150 - 440 K/uL  Comprehensive metabolic panel     Status: Abnormal   Collection Time: 11/04/17  5:02 AM  Result Value Ref Range  Sodium 133 (L) 135 - 145 mmol/L   Potassium 4.7 3.5 - 5.1 mmol/L   Chloride 105 101 - 111 mmol/L   CO2 20 (L) 22 - 32 mmol/L   Glucose, Bld 116 (H) 65 - 99 mg/dL   BUN 26 (H) 6 - 20 mg/dL   Creatinine, Ser 1.61 0.44 - 1.00 mg/dL   Calcium 7.0 (L) 8.9 - 10.3 mg/dL   Total Protein 5.1 (L) 6.5 - 8.1 g/dL   Albumin 2.1 (L) 3.5 - 5.0 g/dL   AST 30 15 - 41 U/L   ALT 14 14 - 54 U/L   Alkaline Phosphatase 133 (H) 38 - 126 U/L   Total Bilirubin 0.4 0.3 - 1.2 mg/dL   GFR calc non Af Amer >60 >60 mL/min   GFR calc Af Amer >60 >60 mL/min   Anion gap 8 5 - 15  Uric acid     Status: Abnormal   Collection Time: 11/04/17  5:02 AM  Result Value Ref Range   Uric Acid, Serum 7.3 (H) 2.3 - 6.6 mg/dL  Magnesium     Status: Abnormal   Collection Time: 11/04/17  5:02 AM  Result Value Ref Range   Magnesium 6.5 (HH) 1.7 - 2.4 mg/dL    Medications    Current Discharge Medication List    CONTINUE these medications which have NOT CHANGED   Details  Multiple Vitamin (MULTIVITAMIN WITH MINERALS) TABS tablet Take 1 tablet by mouth daily.          Assessment:    Pregnancy-induced hypertension without severe features.  Blood pressures have been elevated but stable.  PIH labs-normal  Plan:    Complete 24-hour course of magnesium after last steroid dose.  Transfer to mother-baby after magnesium complete  Daily NSTs with weekly  AFI  Repeat PIH labs weekly or if patient's symptoms change  Fetal growth scan q. 3 to 4 weeks  Delivered by cesarean delivery if baby remains breech and patient becomes PIH with severe features.  Anticipate MFM consult Monday.  Discussed patient's care in detail with the patient and her family.  All questions answered.  Elonda Husky, M.D. 11/04/2017 2:16 PM

## 2017-11-05 NOTE — Progress Notes (Signed)
Patient ID: Ree ShayKathryn A Summers, female   DOB: Oct 20, 1995, [age] y.o.   MRN: [id number]     Subjective:    Pt feels well.  Denies any problems.  Feels better off of the magnesium.  Objective:    Patient Vitals for the past 2 hrs:  BP Temp Temp src Pulse Resp SpO2  11/05/17 0906 (!) 140/103 97.9 F (36.6 C) Oral 66 16 99 %  11/05/17 0850 (!) 139/104 97.9 F (36.6 C) Oral 66 18 99 %   No intake/output data recorded.  Labs: Results for orders placed or performed during the hospital encounter of 11/02/17 (from the past 24 hour(s))  CBC     Status: Abnormal   Collection Time: 11/04/17  6:50 PM  Result Value Ref Range   WBC 9.2 3.6 - 11.0 K/uL   RBC 3.85 3.80 - 5.20 MIL/uL   Hemoglobin 11.8 (L) 12.0 - 16.0 g/dL   HCT 40.934.9 (L) 81.135.0 - 91.447.0 %   MCV 90.6 80.0 - 100.0 fL   MCH 30.5 26.0 - 34.0 pg   MCHC 33.7 32.0 - 36.0 g/dL   RDW 78.215.1 (H) 95.611.5 - 21.314.5 %   Platelets 264 150 - 440 K/uL  Comprehensive metabolic panel     Status: Abnormal   Collection Time: 11/04/17  6:50 PM  Result Value Ref Range   Sodium 134 (L) 135 - 145 mmol/L   Potassium 4.3 3.5 - 5.1 mmol/L   Chloride 105 101 - 111 mmol/L   CO2 20 (L) 22 - 32 mmol/L   Glucose, Bld 106 (H) 65 - 99 mg/dL   BUN 26 (H) 6 - 20 mg/dL   Creatinine, Ser 0.860.82 0.44 - 1.00 mg/dL   Calcium 6.7 (L) 8.9 - 10.3 mg/dL   Total Protein 5.3 (L) 6.5 - 8.1 g/dL   Albumin 2.0 (L) 3.5 - 5.0 g/dL   AST 34 15 - 41 U/L   ALT 15 14 - 54 U/L   Alkaline Phosphatase 141 (H) 38 - 126 U/L   Total Bilirubin 0.2 (L) 0.3 - 1.2 mg/dL   GFR calc non Af Amer >60 >60 mL/min   GFR calc Af Amer >60 >60 mL/min   Anion gap 9 5 - 15  Uric acid     Status: Abnormal   Collection Time: 11/04/17  6:50 PM  Result Value Ref Range   Uric Acid, Serum 7.1 (H) 2.3 - 6.6 mg/dL  Magnesium     Status: Abnormal   Collection Time: 11/04/17  6:50 PM  Result Value Ref Range   Magnesium 6.7 (HH) 1.7 - 2.4 mg/dL    Medications    Current Discharge Medication List    CONTINUE  these medications which have NOT CHANGED   Details  Multiple Vitamin (MULTIVITAMIN WITH MINERALS) TABS tablet Take 1 tablet by mouth daily.           Assessment:     PIH - stable  Off MG++ now.  Plan:    Expectant management  Follow BP's  Discussed management with pt.  Elonda Huskyavid J. Rehanna Oloughlin, M.D. 11/05/2017 9:42 AM

## 2017-11-05 NOTE — Progress Notes (Signed)
Pt back to mother/baby room 345 via wheelchair

## 2017-11-05 NOTE — Progress Notes (Signed)
Transported pt by wheelchair to L&D for NST.

## 2017-11-06 NOTE — Progress Notes (Signed)
Patient ID: Ree ShayKathryn A Archey, female   DOB: Mar 07, 1996, 22 y.o.   MRN: [id number]     Subjective:    No S&S of pre-E.  Feels well.  Objective:    Patient Vitals for the past 2 hrs:  BP Temp Temp src Pulse Resp SpO2  11/06/17 1149 (!) 149/107 98 F (36.7 C) Oral 72 18 99 %   Total I/O In: -  Out: 500 [Urine:500]  Labs: No results found for this or any previous visit (from the past 24 hour(s)).  Medications    Current Discharge Medication List    CONTINUE these medications which have NOT CHANGED   Details  Multiple Vitamin (MULTIVITAMIN WITH MINERALS) TABS tablet Take 1 tablet by mouth daily.          Assessment:    PIH - stable.  BP's slowly increasing.  Not severe range.  Plan:    CPM  Treat HTN if severe range. Consider delivery if necessary.   Elonda Huskyavid J. Evans, M.D. 11/06/2017 11:55 AM

## 2017-11-07 DIAGNOSIS — R7989 Other specified abnormal findings of blood chemistry: Secondary | ICD-10-CM

## 2017-11-07 DIAGNOSIS — Z3A29 29 weeks gestation of pregnancy: Secondary | ICD-10-CM

## 2017-11-07 DIAGNOSIS — O1413 Severe pre-eclampsia, third trimester: Secondary | ICD-10-CM

## 2017-11-07 LAB — CBC
HCT: 34.5 % — ABNORMAL LOW (ref 35.0–47.0)
HEMOGLOBIN: 11.6 g/dL — AB (ref 12.0–16.0)
MCH: 30.5 pg (ref 26.0–34.0)
MCHC: 33.5 g/dL (ref 32.0–36.0)
MCV: 90.8 fL (ref 80.0–100.0)
Platelets: 230 10*3/uL (ref 150–440)
RBC: 3.79 MIL/uL — AB (ref 3.80–5.20)
RDW: 14.9 % — ABNORMAL HIGH (ref 11.5–14.5)
WBC: 7.7 10*3/uL (ref 3.6–11.0)

## 2017-11-07 LAB — COMPREHENSIVE METABOLIC PANEL
ALK PHOS: 127 U/L — AB (ref 38–126)
ALT: 16 U/L (ref 14–54)
AST: 35 U/L (ref 15–41)
Albumin: 1.8 g/dL — ABNORMAL LOW (ref 3.5–5.0)
Anion gap: 6 (ref 5–15)
BUN: 33 mg/dL — ABNORMAL HIGH (ref 6–20)
CALCIUM: 7.4 mg/dL — AB (ref 8.9–10.3)
CO2: 21 mmol/L — ABNORMAL LOW (ref 22–32)
CREATININE: 0.71 mg/dL (ref 0.44–1.00)
Chloride: 109 mmol/L (ref 101–111)
Glucose, Bld: 83 mg/dL (ref 65–99)
Potassium: 4.4 mmol/L (ref 3.5–5.1)
Sodium: 136 mmol/L (ref 135–145)
TOTAL PROTEIN: 4.4 g/dL — AB (ref 6.5–8.1)
Total Bilirubin: 0.4 mg/dL (ref 0.3–1.2)

## 2017-11-07 MED ORDER — LABETALOL HCL 200 MG PO TABS: 200.0000 mg | ORAL_TABLET | Freq: Two times a day (BID) | ORAL | Status: DC

## 2017-11-07 MED ORDER — ONDANSETRON 4 MG PO TBDP
4.0000 mg | ORAL_TABLET | Freq: Three times a day (TID) | ORAL | Status: DC | PRN
  Administered 2017-11-07 – 2017-11-08 (×2): 4 mg via ORAL
  Filled 2017-11-07 (×2): qty 1

## 2017-11-07 MED ORDER — LABETALOL HCL 200 MG PO TABS
200.0000 mg | ORAL_TABLET | Freq: Two times a day (BID) | ORAL | Status: DC
  Administered 2017-11-07 – 2017-11-08 (×3): 200 mg via ORAL
  Filled 2017-11-07 (×3): qty 1

## 2017-11-07 NOTE — Progress Notes (Signed)
Antenatal Progress Note  Subjective:     Patient ID: Erica Summers is a 22 y.o. G1P0 female, 237w4d, Estimated Date of Delivery: 12/29/17 by 32 week ultrasound.  Patient's last menstrual period was 08/22/2017, inconsistent with ultrasound.  She was admitted for severe pre-eclampsia, no prenatal care.  HD# 5.   Subjective:  Patient resting comfortably. Denies complaints this morning.    Review of Systems Denies contractions, leakage of fluids, vaginal bleeding, and still  Unable to really detect fetal movement.     Objective:   Vitals:   11/06/17 1149 11/06/17 1608 11/06/17 1926 11/06/17 2309  BP: (!) 149/107 (!) 141/107 (!) 146/98 (!) 141/106  Pulse: 72 66 70 62  Resp: 18 18 18 16   Temp: 98 F (36.7 C) 98 F (36.7 C) 98 F (36.7 C) 97.8 F (36.6 C)  TempSrc: Oral Oral  Oral  SpO2: 99% 99% 99% 98%  Weight:      Height:        I/O last 3 completed shifts: In: -  Out: 2550 [Urine:2550] No intake/output data recorded.   General appearance: alert and no distress Lungs: clear to auscultation bilaterally Heart: regular rate and rhythm, S1, S2 normal, no murmur, click, rub or gallop Abdomen: soft, non-tender; bowel sounds normal; no masses,  no organomegaly. Gravid Pelvic: deferred Extremities: extremities with +2 pitting edema in feet, +1 pitting edema in legs up to mid-calf bilaterally. No cyanosis or clubbing.     NST pending this morning.  FHT present at 141.    Labs:   O+/-/HIV-/RPR-.  Hepatitis, Rubella, and Varicella pending. UDS negative.    CBC Latest Ref Rng & Units 11/04/2017 11/04/2017 11/03/2017  WBC 3.6 - 11.0 K/uL 9.2 8.0 7.3  Hemoglobin 12.0 - 16.0 g/dL 11.8(L) 11.1(L) 11.5(L)  Hematocrit 35.0 - 47.0 % 34.9(L) 33.0(L) 33.8(L)  Platelets 150 - 440 K/uL 264 251 254    Lab Results  Component Value Date   CREATININE 0.82 11/04/2017   BUN 26 (H) 11/04/2017   NA 134 (L) 11/04/2017   K 4.3 11/04/2017   CL 105 11/04/2017   CO2 20 (L) 11/04/2017    Lab Results  Component Value Date   ALT 15 11/04/2017   AST 34 11/04/2017   ALKPHOS 141 (H) 11/04/2017   BILITOT 0.2 (L) 11/04/2017    Lab Results  Component Value Date   LABURIC 7.1 (H) 11/04/2017     Assessment:  22 y.o. female 5780w0d, Estimated Date of Delivery: 12/29/17 with:    1.  Pre-eclampsia with severe features 2. No prenatal care, preterm 3. H/o SVT, s/p radiofrequency ablation in January 2019. 4. Abnormal area of placentation  Plan:   1. Pre-eclampsia with severe features - continue to monitor. Is s/p treatment with magnesium sulfate. Continue to treat for severe range BPs. Will consider initiation of Labetalol PO for management. For f/u with MFM today. Will continue to ollow recommendations. Patient due for repeat PIH labs (needed q 72 hrs) 2. S/p full course of antenatal steroids for anticipation of preterm delivery at 34 weeks, or sooner if condition worsens.  Neonatology consult for preterm delivery.  4. H/o SVT.  Patient has been asymptomatic.  Will try to limit cardiac stimulating medications if possible.  5. Will have MFM to re-evaluate placenta due to hypoechoic lesion noted on recent anatomy scan.  Overall anatomy appears normal, growth 19%ile.   6. Regular diet 7. Ted hose when not ambulating.    Hildred Laserherry, Amyri Frenz, MD [facility]

## 2017-11-07 NOTE — Consult Note (Signed)
[facility] Consultation   Chief Complaint: sever preeclampsia at 32 weeks with no prenatal care   HPI: [name] "Erica Summers" is a 22 y.o. G1P0 at 56w4dby [place] of 03/25/17 ( pt quite vague but agrees that late October could be when she conceived )   and  u/s done on 11/03/17 measuring 31 3/7 who presents in consultation from Dr Erica Matesat [facility]  for severe preeclampsia. Pt and her boyfriend Erica Artiswere in the hospital bed - his mother left for the interview.  They met during their freshman year at [employer]- they were in the same dorm. He seems very supportive and plans on being present for delivery - his family [place] in [place]   She reports coming to the ER because of extraordinary leg swelling and being surprised by the pregnancy diagnosis . She reports not feeling fetal movement ( after coaching by nurses she does recognize it) and having light irregular periods - the last one in May. Review of her chart shows a family medicine appointment March 29,2019 for evaluation for ADD where a positive pregnancy test was obtained and pt was informed and called back ( then about 23 weeks).   She denies other problems aside from problems with focus and fatigue while at school until the swelling occurred. No rashes or joint pain. No scotomata , no HA , no RUQ pain  Since her cardiac ablation she reports no cardiac sxs and has normal activity tolerance and can drink caffeine.  Normotensive on prior medical visits. No h/o BP medications.  At admission BP 155/116 , 3+ edema , proteinuria  Labetalol administered , Mag given x 24 hours BTM for fetal lungs  LFTs are normal as are platelets  Sodium was low at admission 130 - normalizing currently 136  BUN elevated , normal creatinine - I did not query patient about dehydration prior to admission U/s shows normal anatomic scan , proportionally grown  Breech normal fluid - placenta with large echolucency 3.5 x 3.6 x 1.8 cm near maternal  surface - no flow on power doppler and some surrounding calcifications suggestive of old abruption versus less likely placental lake - .  TSH 5 .2 with normal free T4 - no past h/o hypothyroidism - presumably recent fatigue is pregnancy related?  gbs positive  Rh pos    Past Medical History: Patient  has a past medical history of SV[place]  Past Surgical History: She  has a past surgical history that includes Wisdom tooth extraction (2016) and Cardiac electrophysiology mapping and ablation (08/2017).  Obstetric History:  OB History    Gravida  1   Para      Term      Preterm      AB      Living        SAB      TAB      Ectopic      Multiple      Live Births             Gynecologic History:  Patient's last menstrual period was 08/22/2017.   Medications: She @[facility]EDP @  Allergies: Patient has No Known Allergies.  Social History: Patient  reports that she has never smoked. She has never used smokeless tobacco. She reports that she does not drink alcohol or use drugs.  Family History: family history is not on file.  Review of Systems A full 12 point review of systems was negative or  as noted in the History of Present Illness.  Physical Exam: BP (!) 158/108 (BP Location: Right Arm)   Pulse 65   Temp 98.5 F (36.9 C) (Oral)   Resp 18   Ht 5' 7.5" (1.715 m)   Wt 177 lb 12.8 oz (80.6 kg)   LMP 08/22/2017   SpO2 99%   BMI 27.44 kg/m  Mildly edematous young WF  Gravid abdomen  Extremities 2 + edema , brisk DTRs no clonus  No rashes   Asessement: IUP at 32 weeks by 11/03/17 ultrasound - I did not push her about recollection of LMP with boyfriend present -listed as 32 4/7 in chart   1. No prenatal care in current pregnancy   2. Severe preeclampsia by BP in ER now in mild range on labetalol  3 Breech  4 Concern for possible old abruption on u/s and h/o spotting in May , Rh pos  5 resolved hyponatremia  6 elevated TSH  7 GBS pos      Plan: I reviewed with patient that I agree with current diagnosis of severe preex and management . I told her that the cure for preex is delivery but given prematurity we hope to delay delivery until 34 weeks. I agree with hospitalization until then, use of labetalol to maintain BP  Below 160/110 .  If pt requires second BP agent, LFTs, creat , platelets  labs become abnormal or sodium unable to be corrected , pt develops severe HA not responding to comfort measures, then move to delivery.   If fetus remains breech at 61 w then primary cesarean and post partum magnesium.  Incidental finding of possible old abruption - Daily NSTs weekly AFI , consider ordering a Kleihauer Betke test ( pt is Rh positive but if elevated might intensify monitoring to twice daily NST, MCA dopplers with monitoring  and lower threshold for moving to delivery )   Consider antiphospholipid antibody labs - lupus anticoagulant, anticardiolipin, antibeta 2 glycoprotein given early preex and possible abruption   Send placenta to path after delivery I advised pt of her higher risk of preeclampsia in future pregnancies and need for aspirin prophylaxis   I don't think her cardiac history will be an issue- she can tolerate additional strain of preex and peripartum increased cardiac output .  Elevated TSH 5.2 - unexpected finding - repeat TSH and if still up start levothyroxine at 0.025 or 0.050 mg - repeat labs in 2-3 weeks .   Teen pregnancy- pt appears to have support and to have adjusted to the plan to have a baby . She plans to do school on line and stay at home with the baby. We did not discuss contraception.       Total time spent with the patient was 30 minutes with greater than 50% spent in counseling and coordination of care. We appreciate this interesting consult and will be happy to be involved in the ongoing care of [name] in anyway her obstetricians desire.  Erica Summers,  [facility]  Pager [phone]

## 2017-11-08 DIAGNOSIS — O0933 Supervision of pregnancy with insufficient antenatal care, third trimester: Secondary | ICD-10-CM

## 2017-11-08 DIAGNOSIS — Z3A29 29 weeks gestation of pregnancy: Secondary | ICD-10-CM

## 2017-11-08 DIAGNOSIS — O1493 Unspecified pre-eclampsia, third trimester: Secondary | ICD-10-CM

## 2017-11-08 LAB — TSH: TSH: 5.187 u[IU]/mL — AB (ref 0.350–4.500)

## 2017-11-08 MED ORDER — HYDRALAZINE HCL 20 MG/ML IJ SOLN
10.0000 mg | Freq: Once | INTRAMUSCULAR | Status: AC | PRN
  Administered 2017-11-08: 10 mg via INTRAVENOUS
  Filled 2017-11-08: qty 1

## 2017-11-08 MED ORDER — LABETALOL HCL 200 MG PO TABS: 300.0000 mg | ORAL_TABLET | Freq: Two times a day (BID) | ORAL | Status: DC

## 2017-11-08 NOTE — Progress Notes (Signed)
Antenatal Progress Note  Subjective:     Patient ID: Erica Summers is a 22 y.o. G1P0 female, 8041w5d, Estimated Date of Delivery: 12/29/17 by 32 week ultrasound.   She was admitted for severe pre-eclampsia, no prenatal care.  HD# 6.   Subjective:  Patient resting comfortably. Does report swelling in her right arm where her IV came out yesterday.   Review of Systems Denies contractions, leakage of fluids, vaginal bleeding.  Is noting fetal movement. Denies headaches, blurred vision, chest pain, SOB, RUQ pain.     Objective:   Vitals:   11/07/17 1925 11/07/17 2254 11/08/17 0732 11/08/17 0807  BP: (!) 135/105 (!) 144/98 (!) 158/108 (!) 163/116  Pulse: 90 74 98 69  Resp: 18 16 17    Temp: 98 F (36.7 C) 98 F (36.7 C) 98.7 F (37.1 C)   TempSrc: Oral Oral Oral   SpO2: 99% 97% 98% 98%  Weight:      Height:        No intake/output data recorded. No intake/output data recorded.   General appearance: alert and no distress Lungs: clear to auscultation bilaterally Heart: regular rate and rhythm, S1, S2 normal, no murmur, click, rub or gallop Abdomen: soft, non-tender; bowel sounds normal; no masses,  no organomegaly. Gravid Pelvic: deferred Extremities: extremities with +2 pitting edema in feet, +2 pitting edema in legs up to mid-calf bilaterally. No cyanosis or clubbing. Right arm with swelling around antecubital fossa, but non-tender, no erythema present.     NST INTERPRETATION: Indications: pre-eclampsia  Mode: External Baseline Rate (A): 120 bpm Variability: Moderate Accelerations: 10 x 10 Decelerations: None     Contraction Frequency (min): None  Impression: reactive    Labs:   O+/-/HIV-/RPR-.  Hepatitis, Rubella, and Varicella pending. UDS negative.    CBC Latest Ref Rng & Units 11/07/2017 11/04/2017 11/04/2017  WBC 3.6 - 11.0 K/uL 7.7 9.2 8.0  Hemoglobin 12.0 - 16.0 g/dL 11.6(L) 11.8(L) 11.1(L)  Hematocrit 35.0 - 47.0 % 34.5(L) 34.9(L) 33.0(L)  Platelets  150 - 440 K/uL 230 264 251    Lab Results  Component Value Date   CREATININE 0.71 11/07/2017   BUN 33 (H) 11/07/2017   NA 136 11/07/2017   K 4.4 11/07/2017   CL 109 11/07/2017   CO2 21 (L) 11/07/2017   Lab Results  Component Value Date   ALT 16 11/07/2017   AST 35 11/07/2017   ALKPHOS 127 (H) 11/07/2017   BILITOT 0.4 11/07/2017    Lab Results  Component Value Date   LABURIC 7.1 (H) 11/04/2017    Lab Results  Component Value Date   TSH 5.220 (H) 11/02/2017   T4TOTAL 9.4 11/03/2017     Assessment:  22 y.o. female 608w0d, Estimated Date of Delivery: 12/29/17 with:    1.  Pre-eclampsia with severe features 2. No prenatal care, preterm 3. H/o SVT, s/p radiofrequency ablation in January 2019. 4. Abnormal area of placentation 5. Elevated TSH  Plan:   1. Pre-eclampsia with severe features - continue to monitor, treat for severe range BPs. Was initiated on PO Labetalol but having a reaction to the medication. Was able to tolerate IV labetalol in the past. Will need to re-establish IV access. Will discontinue PO.  Patient to continue repeat PIH labs every 72 hrs (next due 6/7).  If patient's BPs are remaining significantly elevated requiring treatment or any lab abnormalities or unmanageable symptoms noted. S/p MFM consultation yesterday, will continue recommendations.  Recommend antiphospholipid antibody labs.  Will continue to  plan for C-section as mode of delivery due to concerns for placenta and breech presentation.   2. S/p full course of antenatal steroids for anticipation of preterm delivery at 34 weeks, or sooner if condition worsens.  Neonatology consult for preterm delivery when anticipated.   3. H/o SVT.  Patient has been asymptomatic.  Will try to limit cardiac stimulating medications if possible.   4. Abnormal placental region - per MFM, may be evidence of possible old abruption. Recommendations include: consider ordering a Kleihauer Betke test ( pt is Rh positive  but if elevated might intensify monitoring to twice daily NST, MCA dopplers with monitoring  and lower threshold for moving to delivery )    5. Elevated TSH - level was 5.2 on 5/29 with normal T3 and T4. Per MFM, to repeat TSH and if still up start levothyroxine at 0.025 or 0.050 mg - repeat labs in 2-3 weeks .  6. Regular diet  7. Ted hose when not ambulating.    Hildred Laser, MD [facility]

## 2017-11-08 NOTE — Progress Notes (Signed)
BP elevated at 163/116 per NT. Dr Valentino Saxonherry present and aware, ordered hydralazine 10mg  IV once PRN. Obtained IV access, then administered medication as ordered. Will recheck BP and continue to monitor.

## 2017-11-08 NOTE — Progress Notes (Signed)
Informed patient of strict intake/output order. Hat for urine measurement is already in pt toilet, sheet and education provided regarding input. Pt and significant other at bedside verbalized understanding.

## 2017-11-09 ENCOUNTER — Encounter
Admission: RE | Disposition: A | Discharge status: Home / Self Care | Payer: Self-pay | Source: Home / Self Care | Attending: Obstetrics and Gynecology

## 2017-11-09 ENCOUNTER — Inpatient Hospital Stay: Payer: BLUE CROSS/BLUE SHIELD | Admitting: Anesthesiology

## 2017-11-09 ENCOUNTER — Encounter: Payer: Self-pay | Admitting: Anesthesiology

## 2017-11-09 DIAGNOSIS — Z3A30 30 weeks gestation of pregnancy: Secondary | ICD-10-CM

## 2017-11-09 LAB — CBC WITH DIFFERENTIAL/PLATELET
BASOS PCT: 1 %
Basophils Absolute: 0.1 10*3/uL (ref 0–0.1)
EOS ABS: 0 10*3/uL (ref 0–0.7)
EOS PCT: 0 %
HCT: 34.9 % — ABNORMAL LOW (ref 35.0–47.0)
HEMOGLOBIN: 11.7 g/dL — AB (ref 12.0–16.0)
LYMPHS ABS: 2.5 10*3/uL (ref 1.0–3.6)
Lymphocytes Relative: 27 %
MCH: 30.6 pg (ref 26.0–34.0)
MCHC: 33.7 g/dL (ref 32.0–36.0)
MCV: 90.8 fL (ref 80.0–100.0)
MONO ABS: 0.6 10*3/uL (ref 0.2–0.9)
MONOS PCT: 6 %
Neutro Abs: 6.1 10*3/uL (ref 1.4–6.5)
Neutrophils Relative %: 66 %
PLATELETS: 229 10*3/uL (ref 150–440)
RBC: 3.84 MIL/uL (ref 3.80–5.20)
RDW: 15.4 % — ABNORMAL HIGH (ref 11.5–14.5)
WBC: 9.3 10*3/uL (ref 3.6–11.0)

## 2017-11-09 LAB — TYPE AND SCREEN
ABO/RH(D): O POS
ANTIBODY SCREEN: NEGATIVE

## 2017-11-09 LAB — BETA-2-GLYCOPROTEIN I ABS, IGG/M/A
Beta-2 Glyco I IgG: <9 GPI IgG units (ref 0–20)
Beta-2-Glycoprotein I IgM: <9 GPI IgM units (ref 0–32)

## 2017-11-09 SURGERY — Surgical Case
Anesthesia: Choice | Site: Abdomen | Wound class: Clean Contaminated

## 2017-11-09 MED ORDER — LACTATED RINGERS IV SOLN
INTRAVENOUS | Status: DC
  Administered 2017-11-09 (×4): via INTRAVENOUS

## 2017-11-09 MED ORDER — MORPHINE SULFATE (PF) 0.5 MG/ML IJ SOLN
INTRAMUSCULAR | Status: DC | PRN
  Administered 2017-11-09: .1 mg via EPIDURAL

## 2017-11-09 MED ORDER — ONDANSETRON HCL 4 MG/2ML IJ SOLN
INTRAMUSCULAR | Status: DC | PRN
  Administered 2017-11-09: 4 mg via INTRAVENOUS

## 2017-11-09 MED ORDER — ACETAMINOPHEN 325 MG PO TABS: 650.0000 mg | ORAL_TABLET | ORAL | Status: DC | PRN

## 2017-11-09 MED ORDER — KETOROLAC TROMETHAMINE 30 MG/ML IJ SOLN
INTRAMUSCULAR | Status: AC
  Administered 2017-11-09: 30 mg via INTRAVENOUS
  Filled 2017-11-09: qty 1

## 2017-11-09 MED ORDER — CEFAZOLIN SODIUM-DEXTROSE 2-3 GM-%(50ML) IV SOLR
INTRAVENOUS | Status: DC | PRN
  Administered 2017-11-09: 2 g via INTRAVENOUS

## 2017-11-09 MED ORDER — LACTATED RINGERS IV SOLN: INTRAVENOUS | Status: DC

## 2017-11-09 MED ORDER — SENNOSIDES-DOCUSATE SODIUM 8.6-50 MG PO TABS
2.0000 | ORAL_TABLET | ORAL | Status: DC
  Filled 2017-11-09: qty 2

## 2017-11-09 MED ORDER — CEFAZOLIN SODIUM-DEXTROSE 2-4 GM/100ML-% IV SOLN
2.0000 g | Freq: Once | INTRAVENOUS | Status: DC
  Filled 2017-11-09: qty 100

## 2017-11-09 MED ORDER — MAGNESIUM SULFATE 40 G IN LACTATED RINGERS - SIMPLE
2.0000 g/h | INTRAVENOUS | Status: DC
  Administered 2017-11-10: 2 g/h via INTRAVENOUS
  Filled 2017-11-09 (×2): qty 500

## 2017-11-09 MED ORDER — OXYCODONE-ACETAMINOPHEN 5-325 MG PO TABS: 2.0000 | ORAL_TABLET | ORAL | Status: DC | PRN

## 2017-11-09 MED ORDER — SODIUM CHLORIDE FLUSH 0.9 % IV SOLN
INTRAVENOUS | Status: AC
  Filled 2017-11-09: qty 10

## 2017-11-09 MED ORDER — IBUPROFEN 600 MG PO TABS
600.0000 mg | ORAL_TABLET | Freq: Four times a day (QID) | ORAL | Status: DC
  Administered 2017-11-10 – 2017-11-13 (×12): 600 mg via ORAL
  Filled 2017-11-09 (×12): qty 1

## 2017-11-09 MED ORDER — LIDOCAINE 5 % EX PTCH
MEDICATED_PATCH | CUTANEOUS | Status: AC
  Filled 2017-11-09: qty 1

## 2017-11-09 MED ORDER — HYDRALAZINE HCL 20 MG/ML IJ SOLN
INTRAMUSCULAR | Status: AC
  Administered 2017-11-09: 10 mg via INTRAVENOUS
  Filled 2017-11-09: qty 1

## 2017-11-09 MED ORDER — OXYTOCIN 40 UNITS IN LACTATED RINGERS INFUSION - SIMPLE MED
INTRAVENOUS | Status: AC
  Filled 2017-11-09: qty 1000

## 2017-11-09 MED ORDER — ZOLPIDEM TARTRATE 5 MG PO TABS: 5.0000 mg | ORAL_TABLET | Freq: Every evening | ORAL | Status: DC | PRN

## 2017-11-09 MED ORDER — OXYCODONE-ACETAMINOPHEN 5-325 MG PO TABS
1.0000 | ORAL_TABLET | ORAL | Status: DC | PRN
  Filled 2017-11-09: qty 1

## 2017-11-09 MED ORDER — PROPOFOL 10 MG/ML IV BOLUS
INTRAVENOUS | Status: AC
  Filled 2017-11-09: qty 20

## 2017-11-09 MED ORDER — FUROSEMIDE 10 MG/ML IJ SOLN
INTRAMUSCULAR | Status: AC
  Filled 2017-11-09: qty 4

## 2017-11-09 MED ORDER — OXYTOCIN 40 UNITS IN LACTATED RINGERS INFUSION - SIMPLE MED
2.5000 [IU]/h | INTRAVENOUS | Status: AC
  Administered 2017-11-10: 2.5 [IU]/h via INTRAVENOUS
  Filled 2017-11-09: qty 1000

## 2017-11-09 MED ORDER — DEXTROSE IN LACTATED RINGERS 5 % IV SOLN: INTRAVENOUS | Status: DC

## 2017-11-09 MED ORDER — FUROSEMIDE 10 MG/ML IJ SOLN
INTRAMUSCULAR | Status: DC | PRN
  Administered 2017-11-09: 10 mg via INTRAMUSCULAR

## 2017-11-09 MED ORDER — LABETALOL HCL 5 MG/ML IV SOLN
20.0000 mg | INTRAVENOUS | Status: DC | PRN
  Filled 2017-11-09: qty 16

## 2017-11-09 MED ORDER — LEVOTHYROXINE SODIUM 25 MCG PO TABS
25.0000 ug | ORAL_TABLET | Freq: Every day | ORAL | Status: DC
  Administered 2017-11-09 – 2017-11-13 (×5): 25 ug via ORAL
  Filled 2017-11-09 (×6): qty 1

## 2017-11-09 MED ORDER — SOD CITRATE-CITRIC ACID 500-334 MG/5ML PO SOLN
30.0000 mL | ORAL | Status: AC
  Administered 2017-11-09: 30 mL via ORAL
  Filled 2017-11-09: qty 15

## 2017-11-09 MED ORDER — KETOROLAC TROMETHAMINE 30 MG/ML IJ SOLN
30.0000 mg | Freq: Once | INTRAMUSCULAR | Status: AC
  Administered 2017-11-09: 30 mg via INTRAVENOUS

## 2017-11-09 MED ORDER — MORPHINE SULFATE (PF) 2 MG/ML IV SOLN: 1.0000 mg | INTRAVENOUS | Status: AC | PRN

## 2017-11-09 MED ORDER — LIDOCAINE 5 % EX PTCH
MEDICATED_PATCH | CUTANEOUS | Status: DC | PRN
  Administered 2017-11-09: 1 via TRANSDERMAL

## 2017-11-09 MED ORDER — SIMETHICONE 80 MG PO CHEW
80.0000 mg | CHEWABLE_TABLET | Freq: Four times a day (QID) | ORAL | Status: DC
Start: 2017-11-09 — End: 2017-11-13
  Administered 2017-11-09 – 2017-11-13 (×13): 80 mg via ORAL
  Filled 2017-11-09 (×13): qty 1

## 2017-11-09 MED ORDER — HYDRALAZINE HCL 20 MG/ML IJ SOLN
10.0000 mg | Freq: Once | INTRAMUSCULAR | Status: AC
  Administered 2017-11-09: 10 mg via INTRAVENOUS

## 2017-11-09 MED ORDER — PRENATAL MULTIVITAMIN CH: 1.0000 | ORAL_TABLET | Freq: Every day | ORAL | Status: DC

## 2017-11-09 MED ORDER — DIPHENHYDRAMINE HCL 25 MG PO CAPS
25.0000 mg | ORAL_CAPSULE | Freq: Four times a day (QID) | ORAL | Status: DC | PRN
  Administered 2017-11-09: 25 mg via ORAL
  Filled 2017-11-09: qty 1

## 2017-11-09 MED ORDER — OXYTOCIN 40 UNITS IN LACTATED RINGERS INFUSION - SIMPLE MED
INTRAVENOUS | Status: DC | PRN
  Administered 2017-11-09: 1000 mL via INTRAVENOUS

## 2017-11-09 MED ORDER — ONDANSETRON HCL 4 MG/2ML IJ SOLN: 4.0000 mg | Freq: Once | INTRAMUSCULAR | Status: DC | PRN

## 2017-11-09 MED ORDER — MENTHOL 3 MG MT LOZG
1.0000 | LOZENGE | OROMUCOSAL | Status: DC | PRN
  Filled 2017-11-09: qty 9

## 2017-11-09 MED ORDER — MAGNESIUM SULFATE BOLUS VIA INFUSION
4.0000 g | Freq: Once | INTRAVENOUS | Status: AC
  Administered 2017-11-09: 4 g via INTRAVENOUS
  Filled 2017-11-09: qty 500

## 2017-11-09 MED ORDER — PHENYLEPHRINE HCL 10 MG/ML IJ SOLN
INTRAMUSCULAR | Status: DC | PRN
  Administered 2017-11-09: 50 ug via INTRAVENOUS

## 2017-11-09 MED ORDER — FENTANYL CITRATE (PF) 100 MCG/2ML IJ SOLN: 25.0000 ug | INTRAMUSCULAR | Status: DC | PRN

## 2017-11-09 MED ORDER — MORPHINE SULFATE (PF) 0.5 MG/ML IJ SOLN
INTRAMUSCULAR | Status: AC
  Filled 2017-11-09: qty 10

## 2017-11-09 SURGICAL SUPPLY — 28 items
ADH LQ OCL WTPRF AMP STRL LF (MISCELLANEOUS) ×1
ADHESIVE MASTISOL STRL (MISCELLANEOUS) ×3 IMPLANT
BAG COUNTER SPONGE EZ (MISCELLANEOUS) ×2 IMPLANT
BAG SPNG 4X4 CLR HAZ (MISCELLANEOUS) ×1
CANISTER SUCT 3000ML PPV (MISCELLANEOUS) ×3 IMPLANT
CELL SAVER LIPIGURD (MISCELLANEOUS) ×1 IMPLANT
CHLORAPREP W/TINT 26ML (MISCELLANEOUS) ×6 IMPLANT
COUNTER SPONGE BAG EZ (MISCELLANEOUS) ×1
DEVICE RETRIEVAL ALEXIS 14 (MISCELLANEOUS) ×1 IMPLANT
DRSG TELFA 3X8 NADH (GAUZE/BANDAGES/DRESSINGS) ×3 IMPLANT
EXTRT SYSTEM ALEXIS 14CM (MISCELLANEOUS) ×3
GAUZE SPONGE 4X4 12PLY STRL (GAUZE/BANDAGES/DRESSINGS) ×3 IMPLANT
GLOVE BIOGEL PI ORTHO PRO 7.5 (GLOVE) ×2
GLOVE PI ORTHO PRO STRL 7.5 (GLOVE) ×1 IMPLANT
GOWN STRL REUS W/ TWL LRG LVL3 (GOWN DISPOSABLE) ×2 IMPLANT
GOWN STRL REUS W/TWL LRG LVL3 (GOWN DISPOSABLE) ×6
KIT TURNOVER KIT A (KITS) ×3 IMPLANT
NS IRRIG 1000ML POUR BTL (IV SOLUTION) ×3 IMPLANT
PACK C SECTION AR (MISCELLANEOUS) ×3 IMPLANT
PAD DRESSING TELFA 3X8 NADH (GAUZE/BANDAGES/DRESSINGS) ×1 IMPLANT
PAD OB MATERNITY 4.3X12.25 (PERSONAL CARE ITEMS) ×3 IMPLANT
PAD PREP 24X41 OB/GYN DISP (PERSONAL CARE ITEMS) ×3 IMPLANT
RTRCTR C-SECT PINK 25CM LRG (MISCELLANEOUS) ×2 IMPLANT
SPONGE LAP 18X18 RF (DISPOSABLE) ×3 IMPLANT
SUT VIC AB 0 CTX 36 (SUTURE) ×12
SUT VIC AB 0 CTX36XBRD ANBCTRL (SUTURE) ×2 IMPLANT
SUT VIC AB 1 CT1 36 (SUTURE) ×8 IMPLANT
SUT VICRYL+ 3-0 36IN CT-1 (SUTURE) ×6 IMPLANT

## 2017-11-09 NOTE — Anesthesia Preprocedure Evaluation (Addendum)
Anesthesia Evaluation  Patient identified by MRN, date of birth, ID band Patient awake    Reviewed: Allergy & Precautions, NPO status , Patient's Chart, lab work & pertinent test results  Airway Mallampati: II  TM Distance: >3 FB     Dental   Pulmonary neg pulmonary ROS,    Pulmonary exam normal        Cardiovascular hypertension, Normal cardiovascular exam+ dysrhythmias Supra Ventricular Tachycardia      Neuro/Psych negative neurological ROS  negative psych ROS   GI/Hepatic negative GI ROS, Neg liver ROS,   Endo/Other  negative endocrine ROS  Renal/GU negative Renal ROS     Musculoskeletal negative musculoskeletal ROS (+)   Abdominal Normal abdominal exam  (+)   Peds  Hematology negative hematology ROS (+)   Anesthesia Other Findings   Reproductive/Obstetrics (+) Pregnancy                             Anesthesia Physical Anesthesia Plan  ASA: II and emergent  Anesthesia Plan:    Post-op Pain Management:    Induction:   PONV Risk Score and Plan:   Airway Management Planned:   Additional Equipment:   Intra-op Plan:   Post-operative Plan:   Informed Consent: I have reviewed the patients History and Physical, chart, labs and discussed the procedure including the risks, benefits and alternatives for the proposed anesthesia with the patient or authorized representative who has indicated his/her understanding and acceptance.   Dental advisory given  Plan Discussed with: CRNA and Surgeon  Anesthesia Plan Comments:         Anesthesia Quick Evaluation

## 2017-11-09 NOTE — Anesthesia Post-op Follow-up Note (Signed)
Anesthesia QCDR form completed.        

## 2017-11-09 NOTE — Transfer of Care (Signed)
Immediate Anesthesia Transfer of Care Note  Patient: Erica Summers  Procedure(s) Performed: CESAREAN SECTION (N/A Abdomen)  Patient Location: PACU and Mother/Baby  Anesthesia Type:Spinal  Level of Consciousness: awake, alert  and oriented  Airway & Oxygen Therapy: Patient Spontanous Breathing  Post-op Assessment: Report given to RN  Post vital signs: Reviewed and stable  Last Vitals:  Vitals Value Taken Time  BP 124/77   Temp 97.6   Pulse 73   Resp    SpO2 95     Last Pain:  Vitals:   11/09/17 1805  TempSrc: Oral  PainSc:          Complications: No apparent anesthesia complications

## 2017-11-09 NOTE — Progress Notes (Signed)
Patient ID: Erica ShayKathryn A Summers, female   DOB: 1995/10/31, 22 y.o.   MRN: [id number]      Subjective:    Pt feels "much better" today.  Denies H/A or other symptoms.  Objective:    Patient Vitals for the past 2 hrs:  BP Temp Temp src Pulse Resp  11/09/17 1224 (!) 148/102 - - 77 -  11/09/17 1218 (!) 150/114 97.8 F (36.6 C) Oral 78 20   Total I/O In: 438.8 [P.O.:120; I.V.:318.8] Out: 250 [Urine:250]  Labs: No results found for this or any previous visit (from the past 24 hour(s)).  Medications    Current Discharge Medication List    CONTINUE these medications which have NOT CHANGED   Details  Multiple Vitamin (MULTIVITAMIN WITH MINERALS) TABS tablet Take 1 tablet by mouth daily.          Assessment:    Many labs still pending.  Pt stable with HTN just short of severe range - off labetalol.  Plan:    Continue observation and antenatal testing with close BP f/u.   Repeat labs tomorrow. (CBC - LFT's)  Elonda Huskyavid J. Jacie Tristan, M.D. 11/09/2017 12:42 PM

## 2017-11-09 NOTE — Progress Notes (Signed)
Patient ID: Erica ShayKathryn A [employer], female   DOB: 1995-11-24, 22 y.o.   MRN: [id number]    Subjective:    Patient feels well denies headache.  Objective:    Patient Vitals for the past 2 hrs:  BP Temp Temp src Pulse Resp  11/09/17 1900 (!) 172/122 - - (!) 104 -  11/09/17 1855 (!) 166/110 - - 90 -  11/09/17 1850 (!) 164/115 - - 85 -  11/09/17 1835 (!) 158/115 - - 77 -  11/09/17 1820 (!) 141/101 - - 75 -  11/09/17 1805 (!) 146/103 98.5 F (36.9 C) Oral 76 19  11/09/17 1750 (!) 143/95 - - 76 -  11/09/17 1735 (!) 153/115 - - 79 20  11/09/17 1720 (!) 159/114 - - 81 -  11/09/17 1717 (!) 160/108 - - 78 -   No intake/output data recorded.  Labs: No results found for this or any previous visit (from the past 24 hour(s)).  Medications    Current Discharge Medication List    CONTINUE these medications which have NOT CHANGED   Details  Multiple Vitamin (MULTIVITAMIN WITH MINERALS) TABS tablet Take 1 tablet by mouth daily.         NST:  some accelerations but patient had: Several earlier decelerations likely consistent with variables with prolonged/slow return to baseline.  No contractions noted.  Variability still good.   Assessment:     Preeclampsia with severe features as evidenced by worsening hypertension not responsive to labetalol.  Plan:    Primary cesarean delivery.  Have discussed this in detail with the patient. All questions answered.  Elonda Huskyavid J. Evans, M.D. 11/09/2017 7:06 PM

## 2017-11-09 NOTE — Op Note (Signed)
      OP NOTE  Date: 11/09/2017   9:45 PM Name Erica Summers MR# [id number]  Preoperative Diagnosis: 1. Intrauterine pregnancy at 830w6d Active Problems:   No prenatal care in current pregnancy   Pre-eclampsia affecting pregnancy, antepartum   Elevated blood pressure affecting pregnancy in third trimester, antepartum  2.  Worsening HTN.  (severe range Bps) 3.  Breech presentation  Postoperative Diagnosis: 1. Intrauterine pregnancy at 3530w6d, delivered 2. Viable infant 3. Massive maternal abdominal ascites.  Procedure: 1. Primary Low-Transverse Cesarean Section  Surgeon: Elonda Huskyavid J. Evans, MD   Anesthesia: Spinal    EBL: 400  ml     Findings: 1) female infant, Apgar scores of 8    at 1 minute and 9    at 5 minutes and a birthweight of 62.79  ounces.    2) Normal uterus, tubes and ovaries.    Procedure:  The patient was prepped and draped in the supine position and placed under spinal anesthesia.  A transverse incision was made across the abdomen in a Pfannenstiel manner. If indicated the old scar was systematically removed with sharp dissection.  We carried the dissection down to the level of the fascia.  The fascia was incised in a curvilinear manner.  The fascia was then elevated from the rectus muscles with blunt and sharp dissection.  The rectus muscles were separated laterally exposing the peritoneum.  The peritoneum was carefully entered with care being taken to avoid bowel and bladder.  I encountered a very large amount of clear abdominal ascites (>1500cc).  After suctioning all ascites, a self-retaining retractor was placed.  The visceral peritoneum was incised in a curvilinear fashion across the lower uterine segment creating a bladder flap. A transverse incision was made across the lower uterine segment and extended laterally and superiorly using the bandage scissors.  Artificial rupture membranes was performed and Clear fluid was noted.  The infant was delivered from  the breech position.  The cord was doubly clamped and cut. Cord blood was obtained if appropriate.  The infant was handed to the pediatric personnel who then placed the infant under heat lamps where it was cleaned dried and re-suctioned. The placenta was delivered. The hysterotomy incision was then identified on ring forceps.  The uterine cavity was cleaned with a moist lap sponge.  The hysterotomy incision was closed with a running interlocking suture of Vicryl.  Hemostasis was excellent.  Pitocin was run in the IV and the uterus was found to be firm. The posterior cul-de-sac and gutters were cleaned and inspected.  Hemostasis was noted.  The fascia was then closed with a running suture of #1 Vicryl.  Hemostasis of the subcutaneous tissues was obtained using the Bovie.  The subcutaneous tissues were closed with a running suture of 000 Vicryl.  A subcuticular suture was placed.  Steri-Strips were applied in the usual manner.  A pressure dressing was placed.  The patient went to the recovery room in stable condition.   Elonda Huskyavid J. Evans, M.D. 11/09/2017 9:45 PM

## 2017-11-09 NOTE — Anesthesia Procedure Notes (Signed)
Spinal  Start time: 11/09/2017 8:21 PM End time: 11/09/2017 8:25 PM Staffing Anesthesiologist: Yves Dillarroll, Daisey Caloca, MD Performed: anesthesiologist  Spinal Block Patient position: sitting Prep: Betadine Patient monitoring: cardiac monitor, continuous pulse ox, blood pressure and heart rate Approach: midline Location: L3-4 Needle Needle type: Whitacre  Needle gauge: 25 G Assessment Sensory level: T4 Additional Notes Time out called.  Patient placed in sitting position.  Back prepped and draped in sterile fashion.  A skin wheal was made in the L3-L4 interspace with 1% Lidocaine plain.  Return of clear, colorless CSF in all 4 quadrants.  No blood or paresthesias.  Patient tolerated the procedure well.

## 2017-11-10 ENCOUNTER — Encounter: Payer: Self-pay | Admitting: Obstetrics and Gynecology

## 2017-11-10 LAB — CBC
HEMATOCRIT: 30.9 % — AB (ref 35.0–47.0)
Hemoglobin: 10.7 g/dL — ABNORMAL LOW (ref 12.0–16.0)
MCH: 31.5 pg (ref 26.0–34.0)
MCHC: 34.7 g/dL (ref 32.0–36.0)
MCV: 90.8 fL (ref 80.0–100.0)
Platelets: 203 10*3/uL (ref 150–440)
RBC: 3.41 MIL/uL — ABNORMAL LOW (ref 3.80–5.20)
RDW: 15.2 % — AB (ref 11.5–14.5)
WBC: 12.7 10*3/uL — ABNORMAL HIGH (ref 3.6–11.0)

## 2017-11-10 LAB — KLEIHAUER-BETKE STAIN
Fetal Cells %: 0 %
QUANTITATION FETAL HEMOGLOBIN: 0 mL

## 2017-11-10 LAB — LUPUS ANTICOAGULANT
DPT CONFIRM RATIO: 1.28 ratio (ref 0.00–1.40)
DRVVT: 32 s (ref 0.0–47.0)
PTT LA: 32.6 s (ref 0.0–51.9)
THROMBIN TIME: 18.2 s (ref 0.0–23.0)
dPT: 33 s (ref 0.0–55.0)

## 2017-11-10 LAB — COMPREHENSIVE METABOLIC PANEL
ALBUMIN: 1.6 g/dL — AB (ref 3.5–5.0)
ALK PHOS: 96 U/L (ref 38–126)
ALT: 17 U/L (ref 14–54)
AST: 41 U/L (ref 15–41)
Anion gap: 3 — ABNORMAL LOW (ref 5–15)
BILIRUBIN TOTAL: 0.4 mg/dL (ref 0.3–1.2)
BUN: 26 mg/dL — AB (ref 6–20)
CO2: 20 mmol/L — AB (ref 22–32)
Calcium: 7.4 mg/dL — ABNORMAL LOW (ref 8.9–10.3)
Chloride: 107 mmol/L (ref 101–111)
Creatinine, Ser: 0.79 mg/dL (ref 0.44–1.00)
GFR calc Af Amer: >60 mL/min (ref >60)
GFR calc non Af Amer: >60 mL/min (ref >60)
GLUCOSE: 89 mg/dL (ref 65–99)
POTASSIUM: 4.9 mmol/L (ref 3.5–5.1)
SODIUM: 130 mmol/L — AB (ref 135–145)
TOTAL PROTEIN: 4.1 g/dL — AB (ref 6.5–8.1)

## 2017-11-10 LAB — CARDIOLIPIN ANTIBODIES, IGG, IGM, IGA
Anticardiolipin IgA: <9 APL U/mL (ref 0–11)
Anticardiolipin IgM: <9 MPL U/mL (ref 0–12)

## 2017-11-10 MED ORDER — AMMONIA AROMATIC IN INHA
RESPIRATORY_TRACT | Status: AC
  Filled 2017-11-10: qty 10

## 2017-11-10 MED ORDER — SENNOSIDES-DOCUSATE SODIUM 8.6-50 MG PO TABS
2.0000 | ORAL_TABLET | ORAL | Status: DC
  Administered 2017-11-10 – 2017-11-13 (×4): 2 via ORAL
  Filled 2017-11-10 (×5): qty 2

## 2017-11-10 MED ORDER — BREAST MILK
ORAL | Status: DC
  Filled 2017-11-10: qty 1

## 2017-11-10 NOTE — Progress Notes (Signed)
Spoke to Dr. Logan BoresEvans and given verbal order for patient to shower and visit baby in the SCN. Pt showered and just brought to SCN in wheelchair. Instructed pt to call out if she needed anything and that I would frequently check in on her. Pt verbalized understanding.

## 2017-11-10 NOTE — Progress Notes (Signed)
Patient ID: Erica Summers, female   DOB: Dec 04, 1995, 22 y.o.   MRN: [id number]    Progress Note - Cesarean Delivery  Erica Summers is a 22 y.o. G1P0101 now PP day 1 s/p C-Section, Low Transverse .   Subjective:  Patient reports no problems with eating, bowel movements, voiding, or their wound  Objective:  Vital signs in last 24 hours: Temp:  [97.5 F (36.4 C)-98.6 F (37 C)] 98 F (36.7 C) (06/06 0706) Pulse Rate:  [63-104] 71 (06/06 0906) Resp:  [13-27] 14 (06/06 0706) BP: (114-172)/(68-122) 129/93 (06/06 0906) SpO2:  [90 %-98 %] 94 % (06/06 0915)  Physical Exam:  General: alert and cooperative Lochia: appropriate Uterine Fundus: firm Incision: Dressing intact DVT Evaluation: No evidence of DVT seen on physical exam.    Data Review Recent Labs    11/09/17 1918 11/10/17 0705  HGB 11.7* 10.7*  HCT 34.9* 30.9*    Assessment:  Active Problems:   No prenatal care in current pregnancy   Pre-eclampsia affecting pregnancy, antepartum   Elevated blood pressure affecting pregnancy in third trimester, antepartum   Status post Cesarean section. Doing well postoperatively.   PIH patient stable on magnesium, blood pressures improved  Patient has not begun to diurese yet.  Plan:       Continue current care.  Continue magnesium to complete 24 hours  Follow blood pressure/hypertension     Elonda Huskyavid J. Evans, M.D. 11/10/2017 9:43 AM

## 2017-11-10 NOTE — Progress Notes (Signed)
Pt brought in wheelchair back from SCN to LDR6.

## 2017-11-10 NOTE — Anesthesia Postprocedure Evaluation (Signed)
Anesthesia Post Note  Patient: Erica Summers  Procedure(s) Performed: CESAREAN SECTION (N/A Abdomen)  Patient location during evaluation: PACU Anesthesia Type: Spinal Level of consciousness: oriented and awake and alert Pain management: pain level controlled Vital Signs Assessment: post-procedure vital signs reviewed and stable Respiratory status: spontaneous breathing, respiratory function stable and patient connected to nasal cannula oxygen Cardiovascular status: blood pressure returned to baseline and stable Postop Assessment: no headache, no backache and no apparent nausea or vomiting Anesthetic complications: no     Last Vitals:  Vitals:   11/10/17 0910 11/10/17 0915  BP:    Pulse:    Resp:    Temp:    SpO2: 94% 94%    Last Pain:  Vitals:   11/10/17 0706  TempSrc: Oral  PainSc: 0-No pain                 [name],  Sharrod Achille A

## 2017-11-10 NOTE — Lactation Note (Signed)
This note was copied from a baby's chart. Lactation Consultation Note  Patient Name: Girl Rayford HalstedKathryn Eblen ZOXWR'UToday's Date: 11/10/2017 Reason for consult: Initial assessment;NICU baby   Maternal Data Has patient been taught Hand Expression?: No Does the patient have breastfeeding experience prior to this delivery?: Yes  Feeding Feeding Type: Breast Milk  LATCH Score                   Interventions    Lactation Tools Discussed/Used Pump Review: Setup, frequency, and cleaning;Milk Storage Initiated by:: Leroy Sea(Floreine Kingdon) Date initiated:: 11/10/17   Consult Status Consult Status: Follow-up Parents understand how to establish milk supply by pumping or hand expressing q2/3hrs. Spoke with parents about how to clean pump parts and storage.    Burnadette PeterJaniya M Wilhelmine Krogstad 11/10/2017, 4:06 PM

## 2017-11-10 NOTE — Anesthesia Post-op Follow-up Note (Signed)
  Anesthesia Pain Follow-up Note  Patient: Erica Summers  Day #: 1  Date of Follow-up: 11/10/2017 Time: 10:11 AM  Last Vitals:  Vitals:   11/10/17 0910 11/10/17 0915  BP:    Pulse:    Resp:    Temp:    SpO2: 94% 94%    Level of Consciousness: alert  Pain: none   Side Effects:None  Catheter Site Exam:clean, dry, no drainage     Plan: D/C from anesthesia care at surgeon's request  Starling MannsCurtis,  Favour Aleshire A

## 2017-11-11 LAB — COMPREHENSIVE METABOLIC PANEL
ALK PHOS: 96 U/L (ref 38–126)
ALT: 17 U/L (ref 14–54)
AST: 35 U/L (ref 15–41)
Albumin: 1.5 g/dL — ABNORMAL LOW (ref 3.5–5.0)
Anion gap: 5 (ref 5–15)
BILIRUBIN TOTAL: 0.3 mg/dL (ref 0.3–1.2)
BUN: 18 mg/dL (ref 6–20)
CALCIUM: 6.7 mg/dL — AB (ref 8.9–10.3)
CO2: 21 mmol/L — AB (ref 22–32)
CREATININE: 0.63 mg/dL (ref 0.44–1.00)
Chloride: 109 mmol/L (ref 101–111)
Glucose, Bld: 79 mg/dL (ref 65–99)
Potassium: 4.6 mmol/L (ref 3.5–5.1)
SODIUM: 135 mmol/L (ref 135–145)
TOTAL PROTEIN: 4.1 g/dL — AB (ref 6.5–8.1)

## 2017-11-11 LAB — CBC
HEMATOCRIT: 28 % — AB (ref 35.0–47.0)
HEMOGLOBIN: 9.5 g/dL — AB (ref 12.0–16.0)
MCH: 30.7 pg (ref 26.0–34.0)
MCHC: 33.9 g/dL (ref 32.0–36.0)
MCV: 90.6 fL (ref 80.0–100.0)
Platelets: 195 10*3/uL (ref 150–440)
RBC: 3.09 MIL/uL — AB (ref 3.80–5.20)
RDW: 15.9 % — ABNORMAL HIGH (ref 11.5–14.5)
WBC: 10.8 10*3/uL (ref 3.6–11.0)

## 2017-11-11 LAB — SURGICAL PATHOLOGY

## 2017-11-11 MED ORDER — NIFEDIPINE ER OSMOTIC RELEASE 30 MG PO TB24
30.0000 mg | ORAL_TABLET | Freq: Every day | ORAL | Status: DC
  Administered 2017-11-11: 30 mg via ORAL
  Filled 2017-11-11: qty 1

## 2017-11-11 NOTE — Progress Notes (Signed)
Patient ID: Erica Summers, female   DOB: Jul 14, 1995, 22 y.o.   MRN: [id number]    Progress Note - Cesarean Delivery  Erica Summers is a 22 y.o. G1P0101 now PP day 2 s/p C-Section, Low Transverse .   Subjective:  Patient reports no problems with eating, bowel movements, voiding, or their wound.  Pain controlled.  Objective:  Vital signs in last 24 hours: Temp:  [97.7 F (36.5 C)-98.5 F (36.9 C)] 98.4 F (36.9 C) (06/07 0726) Pulse Rate:  [65-90] 79 (06/07 0726) Resp:  [16-20] 20 (06/07 0726) BP: (120-148)/(77-108) 141/104 (06/07 0726) SpO2:  [91 %-99 %] 97 % (06/07 0726)  Physical Exam:  General: alert, cooperative and no distress Lochia: appropriate Uterine Fundus: firm Incision: healing well, no significant drainage, no significant erythema DVT Evaluation: No evidence of DVT seen on physical exam. +2 LE edema    Data Review Recent Labs    11/10/17 0705 11/11/17 0803  HGB 10.7* 9.5*  HCT 30.9* 28.0*    Assessment:  Active Problems:   No prenatal care in current pregnancy   Pre-eclampsia affecting pregnancy, antepartum   Elevated blood pressure affecting pregnancy in third trimester, antepartum   Status post Cesarean section. Doing well postoperatively.   PIH  Bps remain elevated but not severe range.  In anticipation of discharge, possibly tomorrow, will begin procardia.  (Pt did not tolerate labetalol antenatal)  Plan:       Continue current care.  Possible discharge tomorrow.    Elonda Huskyavid J. Pearson Reasons, M.D. 11/11/2017 8:45 AM

## 2017-11-11 NOTE — Telephone Encounter (Signed)
Patient is currently in the hospital. Per hospital note, discharge is expected for 11/12/17. I will follow up with patient, in reference to [facility] [place].

## 2017-11-11 NOTE — Progress Notes (Signed)
Postpartum Day # 2: Cesarean Delivery  Subjective: Patient reports + flatus and no problems voiding. Is tolerating PO. Pain is well controlled.    Objective: Vitals:   11/11/17 2030 11/12/17 0051 11/12/17 0429 11/12/17 0801  BP: (!) 147/108 (!) 138/107 (!) 155/99 (!) 154/109  Pulse: 86 89 89 78  Resp: 20 18 18 20   Temp: 98.5 F (36.9 C) 98.8 F (37.1 C) 98.6 F (37 C) 98.3 F (36.8 C)  TempSrc: Oral Oral Oral Oral  SpO2: 94%  96% 99%  Weight:      Height:        Physical Exam:  General: alert and no distress Lungs: clear to auscultation bilaterally Breasts: normal appearance, no masses or tenderness Heart: regular rate and rhythm, S1, S2 normal, no murmur, click, rub or gallop Abdomen: soft, non-tender; bowel sounds normal; no masses,  no organomegaly Pelvis: Lochia appropriate, Uterine Fundus firm, Incision: healing well, no significant drainage, no dehiscence, no significant erythema Extremities: DVT Evaluation: No evidence of DVT seen on physical exam. Negative Homan's sign. No cords or calf tenderness. No significant calf/ankle edema.  Recent Labs    11/09/17 1918 11/10/17 0705  HGB 11.7* 10.7*  HCT 34.9* 30.9*    Lab Results  Component Value Date   CREATININE 0.79 11/10/2017   BUN 26 (H) 11/10/2017   NA 130 (L) 11/10/2017   K 4.9 11/10/2017   CL 107 11/10/2017   CO2 20 (L) 11/10/2017    Lab Results  Component Value Date   ALT 17 11/10/2017   AST 41 11/10/2017   ALKPHOS 96 11/10/2017   BILITOT 0.4 11/10/2017    Assessment/Plan: Status post Cesarean section. Doing well postoperatively.  Breastfeeding (via pumping) as infant still in NICU for prematurity, Lactation consult Social Work [facility] no [facility] Contraception undecided Continue regular diet Continue PO pain management Will change IVF to Normal saline if sodium remains low. To recheck this morning.  Currently on Procardia 30 mg daily, initiated yesterday. BPs elevated this morning but dose  due at 1000. Will change to BID.  Can treat with IV labetalol for severe range BPs.  Mild anemia postpartum.  Continue current care.   Plan for discharge tomorrow if BPs more stable.    Hildred LaserAnika Estefany Goebel, MD [facility]

## 2017-11-11 NOTE — Progress Notes (Signed)
Patient walked to wheelchair from bed with staff assistance. VSS. Scheduled Motrin given as scheduled for pain control before leaving unit. [facility] Instructed patient to call if she needed anything and I would check on her frequently.

## 2017-11-12 LAB — BASIC METABOLIC PANEL
ANION GAP: 5 (ref 5–15)
BUN: 11 mg/dL (ref 6–20)
CHLORIDE: 113 mmol/L — AB (ref 101–111)
CO2: 20 mmol/L — AB (ref 22–32)
CREATININE: 0.51 mg/dL (ref 0.44–1.00)
Calcium: 7.6 mg/dL — ABNORMAL LOW (ref 8.9–10.3)
GFR calc non Af Amer: >60 mL/min (ref >60)
Glucose, Bld: 110 mg/dL — ABNORMAL HIGH (ref 65–99)
Potassium: 3.9 mmol/L (ref 3.5–5.1)
Sodium: 138 mmol/L (ref 135–145)

## 2017-11-12 MED ORDER — NIFEDIPINE ER OSMOTIC RELEASE 30 MG PO TB24
30.0000 mg | ORAL_TABLET | Freq: Two times a day (BID) | ORAL | Status: DC
  Administered 2017-11-12 – 2017-11-13 (×3): 30 mg via ORAL
  Filled 2017-11-12 (×3): qty 1

## 2017-11-12 NOTE — Progress Notes (Signed)
Patient did not get 1600 vitals due to being in SCN visiting infant. RN encouraged patient to return to room for a short period in order to check vitals, as blood pressures have been elevated. Patient verbalizes understanding and will return to room.

## 2017-11-12 NOTE — Clinical Social Work Note (Signed)
CSW received consult for no prenatal care. CSW will assess when able, today.  Erica PonderKaren Martha Schyler Butikofer, MSW, Theresia MajorsLCSWA [phone]

## 2017-11-12 NOTE — Lactation Note (Signed)
This note was copied from a baby's chart. Lactation Consultation Note  Patient Name: Girl Rayford HalstedKathryn Runkles ZOXWR'UToday's Date: 11/12/2017  Assisted mom with pumping.  Reviewed handling, collection, labeling, storage and transporting her milk.  Discussed how to go through her [employer] insurance and [employer] to get personal DEBP.  Reviewed supply and demand.  Praised mom for her continued efforts to supply breast milk for her 32.6 week preemie baby in SCN.  Lactation name and number written on white board and encouraged to call with any questions, concerns or assistance.     Maternal Data    Feeding Feeding Type: Breast Milk  LATCH Score                   Interventions    Lactation Tools Discussed/Used Tools: 63F feeding tube / Syringe;Pump   Consult Status      Louis MeckelWilliams, Erick Murin Kay 11/12/2017, 3:56 PM

## 2017-11-13 DIAGNOSIS — R188 Other ascites: Secondary | ICD-10-CM

## 2017-11-13 DIAGNOSIS — O321XX0 Maternal care for breech presentation, not applicable or unspecified: Secondary | ICD-10-CM

## 2017-11-13 DIAGNOSIS — O1414 Severe pre-eclampsia complicating childbirth: Principal | ICD-10-CM

## 2017-11-13 MED ORDER — HYDRALAZINE HCL 10 MG PO TABS
10.0000 mg | ORAL_TABLET | Freq: Three times a day (TID) | ORAL | Status: DC
  Administered 2017-11-13: 10 mg via ORAL
  Filled 2017-11-13 (×2): qty 1

## 2017-11-13 MED ORDER — NIFEDIPINE ER 30 MG PO TB24: 30.0000 mg | ORAL_TABLET | Freq: Two times a day (BID) | ORAL | 1 refills | Status: DC

## 2017-11-13 MED ORDER — FERROUS SULFATE 325 (65 FE) MG PO TABS: 325.0000 mg | ORAL_TABLET | Freq: Every day | ORAL | 1 refills | Status: DC

## 2017-11-13 MED ORDER — ACETAMINOPHEN 500 MG PO TABS: 1000.0000 mg | ORAL_TABLET | Freq: Four times a day (QID) | ORAL | 0 refills | Status: DC | PRN

## 2017-11-13 MED ORDER — ACETAMINOPHEN 500 MG PO TABS: 1000.0000 mg | ORAL_TABLET | Freq: Four times a day (QID) | ORAL | Status: DC | PRN

## 2017-11-13 MED ORDER — OXYCODONE HCL 5 MG PO TABS: 5.0000 mg | ORAL_TABLET | Freq: Four times a day (QID) | ORAL | Status: DC | PRN

## 2017-11-13 MED ORDER — OXYCODONE HCL 5 MG PO TABS: 5.0000 mg | ORAL_TABLET | Freq: Four times a day (QID) | ORAL | 0 refills | Status: DC | PRN

## 2017-11-13 MED ORDER — DOCUSATE SODIUM 100 MG PO CAPS: 100.0000 mg | ORAL_CAPSULE | Freq: Two times a day (BID) | ORAL | 2 refills | Status: DC | PRN

## 2017-11-13 MED ORDER — HYDRALAZINE HCL 10 MG PO TABS: 10.0000 mg | ORAL_TABLET | Freq: Three times a day (TID) | ORAL | 1 refills | Status: DC

## 2017-11-13 MED ORDER — LEVOTHYROXINE SODIUM 25 MCG PO TABS: 25.0000 ug | ORAL_TABLET | Freq: Every day | ORAL | 0 refills | Status: DC

## 2017-11-13 MED ORDER — OXYCODONE-ACETAMINOPHEN 5-325 MG PO TABS: 1.0000 | ORAL_TABLET | Freq: Four times a day (QID) | ORAL | Status: DC | PRN

## 2017-11-13 NOTE — Discharge Instructions (Signed)

## 2017-11-13 NOTE — Discharge Summary (Signed)
OB Discharge Summary     Patient Name: Erica Summers DOB: 18-Mar-1996 MRN: [id number]  Date of admission: 11/02/2017 Delivering MD: Linzie CollinEVANS, DAVID JAMES   Date of discharge: 11/13/2017  Admitting diagnosis: L and D Intrauterine pregnancy: 5257w6d     Secondary diagnosis:  Active Problems:   No prenatal care in current pregnancy   Pre-eclampsia affecting pregnancy, antepartum   Elevated blood pressure affecting pregnancy in third trimester, antepartum  Additional problems: History of SVT, s/p cardiac ablation     Discharge diagnosis: Preterm Pregnancy Delivered, Preeclampsia (severe) and Anemia                                                                                            Post partum procedures:Magnesium sulfate x 24 hours postpartum  Augmentation: N/A  Complications: None  Hospital course:    22 y.o. yo G1P0101 at 557w6d was admitted to the hospital from the Emergency Room with diagnosis of severe pre-eclampsia.  Patient of note was unaware of her pregnancy until she presented on that day.  She was treated with Magnesium sulfate for a total 72 hours for the administration of a course of antenatal steroids. Consultation with Maternal Fetal Medicine occurred, with recommendations that if patient was stable with BPs not requiring multiple treatments, patient could be managed expectantly until 34 weeks, with daily NSTs and weekly AFI checks.  The patient was continued to be managed expectantly until 33.6 weeks, where her blood pressures began to require multiple treatments and her fetal tracing became non-reassuring. As her course was complicated by this time, on HD#8 she underwent a cesarean section with the following indication:Malpresentation and Non-Reassuring FHR.  Membrane Rupture Time/Date: 8:46 PM ,11/09/2017   Patient delivered a Viable infant.11/09/2017  Details of operation can be found in separate operative note.  Pateint had an uncomplicated postpartum course. He was  treated for pre-eclampsia with Magnesium sulfate postpartum x 24 hrs, per protocol.  On HD#2 she was initiated on Procardia for her persistently elevated blood pressures.  On HD##3, Hydralazine was added to her blood pressure medications. She is ambulating, tolerating a regular diet, passing flatus, and urinating well. Patient is discharged home in stable condition on  11/13/2017          Physical exam  Vitals:   11/13/17 0510 11/13/17 0807 11/13/17 1125 11/13/17 1559  BP: (!) 139/102 (!) 136/91 (!) 138/98 (!) 140/98  Pulse: (!) 103 97 98 99  Resp: 18 18 18 20   Temp: 98 F (36.7 C) 98.6 F (37 C) 98.5 F (36.9 C) 98.4 F (36.9 C)  TempSrc: Oral Oral Oral Oral  SpO2: 98% 98%    Weight:      Height:       General: alert and no distress Lochia: appropriate Uterine Fundus: firm Incision: Healing well with no significant drainage, No significant erythema, Dressing is clean, dry, and intact DVT Evaluation: No evidence of DVT seen on physical exam. Negative Homan's sign. No cords or calf tenderness. No significant calf/ankle edema.   Labs: Lab Results  Component Value Date   WBC 10.8 11/11/2017   HGB 9.5 (L)  11/11/2017   HCT 28.0 (L) 11/11/2017   MCV 90.6 11/11/2017   PLT 195 11/11/2017   CMP Latest Ref Rng & Units 11/12/2017  Glucose 65 - 99 mg/dL 409(W)  BUN 6 - 20 mg/dL 11  Creatinine 1.19 - 1.47 mg/dL 8.29  Sodium 562 - 130 mmol/L 138  Potassium 3.5 - 5.1 mmol/L 3.9  Chloride 101 - 111 mmol/L 113(H)  CO2 22 - 32 mmol/L 20(L)  Calcium 8.9 - 10.3 mg/dL 7.6(L)  Total Protein 6.5 - 8.1 g/dL -  Total Bilirubin 0.3 - 1.2 mg/dL -  Alkaline Phos 38 - 865 U/L -  AST 15 - 41 U/L -  ALT 14 - 54 U/L -    Discharge instruction: per After Visit Summary and "Baby and Me Booklet".  After visit meds:  Allergies as of 11/13/2017   No Known Allergies     Medication List    STOP taking these medications   multivitamin with minerals Tabs tablet     TAKE these medications    acetaminophen 500 MG tablet Commonly known as:  TYLENOL Take 2 tablets (1,000 mg total) by mouth every 6 (six) hours as needed for fever or headache.   docusate sodium 100 MG capsule Commonly known as:  COLACE Take 1 capsule (100 mg total) by mouth 2 (two) times daily as needed for mild constipation.   ferrous sulfate 325 (65 FE) MG tablet Commonly known as:  FERROUSUL Take 1 tablet (325 mg total) by mouth daily with breakfast.   hydrALAZINE 10 MG tablet Commonly known as:  APRESOLINE Take 1 tablet (10 mg total) by mouth every 8 (eight) hours.   levothyroxine 25 MCG tablet Commonly known as:  SYNTHROID, LEVOTHROID Take 1 tablet (25 mcg total) by mouth daily before breakfast.   NIFEdipine 30 MG 24 hr tablet Commonly known as:  PROCARDIA-XL/ADALAT CC Take 1 tablet (30 mg total) by mouth 2 (two) times daily.       Diet: routine diet  Activity: Advance as tolerated. Pelvic rest for 6 weeks.   Outpatient follow up:3-4 days for BP check.  Follow up Appt: Future Appointments  Date Time Provider Department Center  11/25/2017  8:30 AM Joana Reamer, PhD [facility] None   Follow up Visit:No follow-ups on file.  Postpartum contraception: Nexplanon  Newborn Data: Live born female  Birth Weight: 3 lb 14.8 oz (1780 g) APGAR: 8, 9  Newborn Delivery   Birth date/time:  11/09/2017 20:50:00 Delivery type:  C-Section, Low Transverse Trial of labor:  No C-section categorization:  Primary     Baby Feeding: Bottle and Breast Disposition:NICU   11/13/2017 Hildred Laser, MD

## 2017-11-13 NOTE — Clinical Social Work Maternal (Addendum)
CLINICAL SOCIAL WORK MATERNAL/CHILD NOTE  Patient Details  Name: TIEN AISPURO MRN: [id number] Date of Birth: 25-Apr-1996  Date:  11/13/2017  Clinical Social Worker Initiating Note:  Santiago Bumpers, MSW, [place] Date/Time: Initiated:  11/13/17/1506     Child's Name:  Erica Summers   Biological Parents:  Mother, Father   Need for Interpreter:  None   Reason for Referral:  Parental Support of Premature Babies < 42 weeks/or Critically Ill babies, Late or No Prenatal Care    Address:  [place] [address]    Phone number:  [phone] (home)     Additional phone number:[phone]  Household Members/Support Persons (HM/SP):   Household Member/Support Person 1, Household Member/Support Person 2   HM/SP Name Relationship DOB or Age  HM/SP -[place] Mother    HM/SP -[place] Father    HM/SP -3        HM/SP -4        HM/SP -5        HM/SP -6        HM/SP -7        HM/SP -8          Natural Supports (not living in the home):  [place], Medical laboratory scientific officer, Extended Family, Friends, Immediate Family, Armed forces training and education officer Supports: None   Employment: Ship broker   Type of Work: Ship broker at [employer]:  Attending college   Homebound arranged:    Museum/gallery curator Resources:  Multimedia programmer   Other Resources:      Cultural/Religious Considerations Which May Impact Care:  None noted  Strengths:  Ability to meet basic needs , Compliance with medical plan , Home prepared for child , Understanding of illness, Pediatrician chosen   Psychotropic Medications:         Pediatrician:    [employer]  Pediatrician List:   [place]      Pediatrician Fax Number:    Risk Factors/Current Problems:  Other (Comment)(NICU admission)   Cognitive State:  Able to Concentrate , Linear Thinking ,  Goal Oriented , Insightful    Mood/Affect:  Euthymic , Happy    CSW Assessment: The CSW met with the patient, her parents, family and friends at bedside to discuss CSW role in care. The CSW introduced self and gathered information about the patient. The patient claims that she did not know that she was pregnant until 1 week ago when she came to the ED due to leg swelling. The patient's chart shows that she had been notified of the pregnancy at an office visit per a note dated 3/29: "Pt with positive pregnancy test. She is in shock and does not wish to have any further confirmation. Does not know what she will do. I will contact her next week to  Check on her and  To see if she has determined her plan or needs assistance." The CSW did not address this or confront the patient as her parents are supportive and discord caused by that information would possible exacerbate the patient's HTN symptoms as well as cause unneeded distress.  The patient will continue college online for the upcoming semester and live in her parents' home. The patient shared that the FOB does know about the child and is involved. According to the attending RN, SCN RN, and lactation RN, the patient  has been bonding appropriately with the baby and is excited. The patient's family are assisting the patient with attending to aftercare for the C-section, and her plan is to follow-up with Dr. Marcelline Mates. The infant will remain in the SCN until stable for discharge and seems to be thriving at this time. The CSW will continue to follow until the infant discharges should the patient need additional assistance. Finally, the CSW provided psychoeducation about postpartum depression and postpartum anxiety.   CSW Plan/Description:  No Further Intervention Required/No Barriers to Discharge    Zettie Pho, LCSW 11/13/2017, 3:10 PM

## 2017-11-13 NOTE — Progress Notes (Addendum)
Postpartum Day # 3: Cesarean Delivery, with severe pre-eclampsia s/p magnesium sulfate treatment.  Subjective: Patient reports tolerating PO, + flatus, + BM and no problems voiding. Is ambulating without difficulty. Pain is well controlled.    Objective: Vitals:   11/12/17 1846 11/12/17 2133 11/13/17 0040 11/13/17 0510  BP: (!) 145/104 (!) 148/92 (!) 139/103 (!) 139/102  Pulse: 90 96 77 (!) 103  Resp: 20 18 18 18   Temp:  98.6 F (37 C) 98.1 F (36.7 C) 98 F (36.7 C)  TempSrc:  Oral Oral Oral  SpO2:  98% 99% 98%  Weight:      Height:        Physical Exam:  General: alert and no distress Lungs: clear to auscultation bilaterally Breasts: normal appearance, no masses or tenderness Heart: regular rate and rhythm, S1, S2 normal, no murmur, click, rub or gallop Abdomen: soft, non-tender; bowel sounds normal; no masses,  no organomegaly Pelvis: Lochia appropriate, Uterine Fundus firm, Incision: healing well, no significant drainage, no dehiscence, no significant erythema Extremities: DVT Evaluation: No evidence of DVT seen on physical exam. Negative Homan's sign. No cords or calf tenderness. No significant calf/ankle edema.  CBC Latest Ref Rng & Units 11/11/2017 11/10/2017 11/09/2017  WBC 3.6 - 11.0 K/uL 10.8 12.7(H) 9.3  Hemoglobin 12.0 - 16.0 g/dL 1.6(X9.5(L) 10.7(L) 11.7(L)  Hematocrit 35.0 - 47.0 % 28.0(L) 30.9(L) 34.9(L)  Platelets 150 - 440 K/uL 195 203 229     Lab Results  Component Value Date   CREATININE 0.51 11/12/2017   BUN 11 11/12/2017   NA 138 11/12/2017   K 3.9 11/12/2017   CL 113 (H) 11/12/2017   CO2 20 (L) 11/12/2017    Lab Results  Component Value Date   ALT 17 11/11/2017   AST 35 11/11/2017   ALKPHOS 96 11/11/2017   BILITOT 0.3 11/11/2017    Assessment/Plan: Status post Cesarean section. Doing well postoperatively.  Breastfeeding (via pumping) as infant still in NICU for prematurity, Lactation consult as needed S/p Social Work consult for no  [facility] Contrace[facility] undecided. Discussed all options. Will give educational materials at discharge.  Continue regular diet Continue PO pain management. Will discontinue ibuprofen due to continued elevated BPs and switch to Tylenol for mild pain.   Sodium levels have returned to normal.   Currently on Procardia 30 mg BID (increased frequency yesterday).  Patient with still good amount of intermittently elevated BPs.  Added 10 mg of Nifidepine q 8 hrs. If no improvement noted, will consult Hospitalist.  Mild anemia postpartum will treat with PO iron daily.   Hypothyroidism of pregnancy - started on 25 mcg of Levothyroxine during antepartum stay. Will need to recheck levels in 2-3 weeks postpartum, per MFM recommendations.  Continue current care.   Plan for discharge later today if BPs more stable, otherwise can d/c tomorrow.    Hildred LaserAnika Stephinie Battisti, MD [facility]

## 2017-11-13 NOTE — Progress Notes (Signed)
Discharge instructions given. Patient verbalizes understanding of teaching. Surgery kit explained and given. Patient discharged home via wheelchair at 1810.

## 2017-11-14 ENCOUNTER — Telehealth: Payer: Self-pay | Admitting: Obstetrics and Gynecology

## 2017-11-14 NOTE — Telephone Encounter (Signed)
The patient called and stated that she is experiencing a headache and is concerned that her blood pressure is high. I informed the patient that a nurse informed her to take Tylenol extra strength every six hours and to keep a eye on her BP at home if possible, and to give us a call back. Please advise.

## 2017-11-14 NOTE — Telephone Encounter (Signed)
Pt is s/p c/s on 11/09/2017. Pt has pre e. Erx procardia. Pt states he mom is going to pharmacy to get now. Aldo encouraged tylenol 1000mg  q6. If no better with meds to contact office. F/u at appt on on 6/12.

## 2017-11-15 NOTE — Telephone Encounter (Signed)
I called and spoke to patient. Patient approved to [place] [place] [facility] [address] [facility] [address]. Patient informed that after review, patient can schedule to be seen after postpartum visit. Patient stated she will call back and schedule after she has postpartum visit. (6 weeks). Patient thanked me for calling.

## 2017-11-16 ENCOUNTER — Ambulatory Visit (INDEPENDENT_AMBULATORY_CARE_PROVIDER_SITE_OTHER): Payer: BLUE CROSS/BLUE SHIELD | Admitting: Obstetrics and Gynecology

## 2017-11-16 VITALS — BP 115/81 | HR 134 | Ht 67.0 in | Wt 128.0 lb

## 2017-11-16 DIAGNOSIS — O1494 Unspecified pre-eclampsia, complicating childbirth: Secondary | ICD-10-CM

## 2017-11-16 NOTE — Progress Notes (Signed)
Pt is here for bp check, recently had cesarean 11/09/17 pre-eclampsia affecting pregnancy.  Pt states she is feeling much better, BP today was 115/81.  States she has appointment next week for incision check and BP, pt voiced understanding

## 2017-11-21 ENCOUNTER — Ambulatory Visit: Payer: Self-pay

## 2017-11-21 NOTE — Lactation Note (Addendum)
This note was copied from a baby's chart. Lactation Consultation Note  Patient Name: Erica Summers NFAOZ'HToday's Date: 11/21/2017  Mom did lick and learn today assisted by B.Foust,LPN.  Baby never latched, but took a few sucks.  Talked with mom about Symphony pump that we had loaned her until she gets her pump in through her insurance.  [facility] that mom requests assistance with lick and learn again around 2:30pm.   Maternal Data    Feeding Feeding Type: Breast Milk Nipple Type: Slow - flow Length of feed: 25 min  LATCH Score                   Interventions    Lactation Tools Discussed/Used Tools: Pump;6F feeding tube / Syringe   Consult Status      Erica Summers, Erica Summers 11/21/2017, 11:47 PM

## 2017-11-22 ENCOUNTER — Encounter: Payer: Self-pay | Admitting: Obstetrics and Gynecology

## 2017-11-23 ENCOUNTER — Encounter: Payer: Self-pay | Admitting: Obstetrics and Gynecology

## 2017-11-23 ENCOUNTER — Ambulatory Visit (INDEPENDENT_AMBULATORY_CARE_PROVIDER_SITE_OTHER): Payer: BLUE CROSS/BLUE SHIELD | Admitting: Obstetrics and Gynecology

## 2017-11-23 VITALS — BP 93/64 | HR 115 | Ht 67.0 in

## 2017-11-23 DIAGNOSIS — O1494 Unspecified pre-eclampsia, complicating childbirth: Secondary | ICD-10-CM

## 2017-11-23 DIAGNOSIS — Z9889 Other specified postprocedural states: Secondary | ICD-10-CM

## 2017-11-23 NOTE — Progress Notes (Signed)
HPI:      [name] is a 22 y.o. G1P0101 who LMP was No LMP recorded.  Subjective:   She presents today a little bit more than 1 week postop from cesarean delivery for severe preeclampsia.  She is currently taking hydralazine daily.  She reports no pain from her incision.  She feels completely well and recovered.  She is currently pumping and bottlefeeding the baby who is still in the NICU.  Baby is doing very well.    Hx: The following portions of the patient's history were reviewed and updated as appropriate:             She  has a past medical history of SVT (supraventricular tachycardia) (HCC). She does not have any pertinent problems on file. She  has a past surgical history that includes Wisdom tooth extraction (2016); Cardiac electrophysiology mapping and ablation (08/2017); and Cesarean section (N/A, 11/09/2017). Her family history is not on file. She  reports that she has never smoked. She has never used smokeless tobacco. She reports that she does not drink alcohol or use drugs. She has a current medication list which includes the following prescription(s): acetaminophen, docusate sodium, ferrous sulfate, hydralazine, levothyroxine, and nifedipine. She has No Known Allergies.       Review of Systems:  Review of Systems  Constitutional: Denied constitutional symptoms, night sweats, recent illness, fatigue, fever, insomnia and weight loss.  Eyes: Denied eye symptoms, eye pain, photophobia, vision change and visual disturbance.  Ears/Nose/Throat/Neck: Denied ear, nose, throat or neck symptoms, hearing loss, nasal discharge, sinus congestion and sore throat.  Cardiovascular: Denied cardiovascular symptoms, arrhythmia, chest pain/pressure, edema, exercise intolerance, orthopnea and palpitations.  Respiratory: Denied pulmonary symptoms, asthma, pleuritic pain, productive sputum, cough, dyspnea and wheezing.  Gastrointestinal: Denied, gastro-esophageal reflux, melena, nausea and  vomiting.  Genitourinary: Denied genitourinary symptoms including symptomatic vaginal discharge, pelvic relaxation issues, and urinary complaints.  Musculoskeletal: Denied musculoskeletal symptoms, stiffness, swelling, muscle weakness and myalgia.  Dermatologic: Denied dermatology symptoms, rash and scar.  Neurologic: Denied neurology symptoms, dizziness, headache, neck pain and syncope.  Psychiatric: Denied psychiatric symptoms, anxiety and depression.  Endocrine: Denied endocrine symptoms including hot flashes and night sweats.   Meds:   Current Outpatient Medications on File Prior to Visit  Medication Sig Dispense Refill  . acetaminophen (TYLENOL) 500 MG tablet Take 2 tablets (1,000 mg total) by mouth every 6 (six) hours as needed for fever or headache. 30 tablet 0  . docusate sodium (COLACE) 100 MG capsule Take 1 capsule (100 mg total) by mouth 2 (two) times daily as needed for mild constipation. 30 capsule 2  . ferrous sulfate (FERROUSUL) 325 (65 FE) MG tablet Take 1 tablet (325 mg total) by mouth daily with breakfast. 60 tablet 1  . hydrALAZINE (APRESOLINE) 10 MG tablet Take 1 tablet (10 mg total) by mouth every 8 (eight) hours. 90 tablet 1  . levothyroxine (SYNTHROID, LEVOTHROID) 25 MCG tablet Take 1 tablet (25 mcg total) by mouth daily before breakfast. 30 tablet 0  . NIFEdipine (PROCARDIA-XL/ADALAT CC) 30 MG 24 hr tablet Take 1 tablet (30 mg total) by mouth 2 (two) times daily. 60 tablet 1   No current facility-administered medications on file prior to visit.     Objective:     Vitals:   11/23/17 1007  BP: 93/64  Pulse: (!) 115               Abdomen: Soft.  Non-tender.  No masses.  No HSM.  Incision/s: Intact.  Healing well.  No erythema.  No drainage.      Assessment:    G1P0101 Patient Active Problem List   Diagnosis Date Noted  . No prenatal care in current pregnancy 11/02/2017  . Pre-eclampsia affecting pregnancy, antepartum 11/02/2017  . Elevated blood  pressure affecting pregnancy in third trimester, antepartum 11/02/2017  . Decreased attention Span 09/02/2017  . Abdominal cramping 09/02/2017  . Irregular menses 09/02/2017  . Fatigue 06/08/2016  . Screen for sexually transmitted diseases 06/08/2016  . PSVT (paroxysmal supraventricular tachycardia) (HCC) 02/10/2016  . Palpitations 08/11/2015  . DUB (dysfunctional uterine bleeding) 02/18/2015     1. Pre-eclampsia, delivered   2. Post-operative state     Blood pressure is low-she is currently on hydralazine daily.   Plan:            1.  Wean hydralazine to 0.  Recheck blood pressure in 10 days.  2.  Wound care discussed. Orders No orders of the defined types were placed in this encounter.   No orders of the defined types were placed in this encounter.     F/U  Return in about 1 month (around 12/21/2017).  Elonda Husky, M.D. 11/23/2017 10:47 AM

## 2017-11-24 ENCOUNTER — Ambulatory Visit: Payer: Self-pay

## 2017-11-24 NOTE — Lactation Note (Signed)
This note was copied from a baby's chart. Lactation Consultation Note  Patient Name: Erica Summers ZOXWR'UToday's Date: 11/24/2017     Maternal Data    Feeding Feeding Type: Breast Milk Nipple Type: Slow - flow Length of feed: 45 min  LATCH Score Latch: Repeated attempts needed to sustain latch, nipple held in mouth throughout feeding, stimulation needed to elicit sucking reflex.  Audible Swallowing: A few with stimulation  Type of Nipple: Everted at rest and after stimulation  Comfort (Breast/Nipple): Soft / non-tender  Hold (Positioning): Assistance needed to correctly position infant at breast and maintain latch.  LATCH Score: 7  Interventions Interventions: Breast feeding basics reviewed;Assisted with latch;Support pillows;Adjust position  Lactation Tools Discussed/Used Tools: Nipple Shields(size 20)   Consult Status  Baby did a lick and learn at the breast. Discussed with parents to have baby feed on empty breast to make the connection with having a full belly and suckling at the breast. Discussed with mother to continue to let baby feed on empty breast until ready for pre/post weights.    Arlyss Gandylicia Joyceline Maiorino 11/24/2017, 3:20 PM

## 2017-11-25 ENCOUNTER — Ambulatory Visit: Payer: BLUE CROSS/BLUE SHIELD | Admitting: Psychology

## 2017-11-27 ENCOUNTER — Ambulatory Visit: Payer: Self-pay

## 2017-11-27 NOTE — Lactation Note (Signed)
This note was copied from a baby's chart. Lactation Consultation Note  Patient Name: Girl Rayford HalstedKathryn Warnick ZOXWR'UToday's Date: 11/27/2017  Mom preparing for discharge of baby.  Mom reports that sometimes Maurine [place]ane[place] will latch and other times cannot get her to latch.  Mom had just gave bottle of FBM 45 minutes earlier so she was sleepy.  Encouraged mom to offer the breast when Maurine CaneKinley was not mad and hungry such as an hour after a feeding when she wants a little more and acts hungry or when she wants to suck offer the breast instead of the pacifier.  Mom has enough to feed [place] because she is pumping approximately 4 oz around every 3 hours.  Mom has not received DEBP in the mail from her maternity [employer] insurance yet so is still [facility].  Mom thinks they are still waiting on doctors order.  Okey RegalCarol will discuss with Dr. Eric FormWimmer today to expedite the process.  Mom planning to come back for Lactation consult in a few days for feeding assessment, pre and post feeding weight and return of [facility] pump.  Mom praised for her commitment to providing her breast milk for her premature baby.  Lactation resource numbers and services provided to mom for follow up.   Maternal Data    Feeding Feeding Type: Bottle Fed - Breast Milk Nipple Type: Slow - flow  LATCH Score                   Interventions    Lactation Tools Discussed/Used Tools: Bottle   Consult Status      Louis MeckelWilliams, Ryelee Albee Kay 11/27/2017, 11:33 AM

## 2017-12-02 ENCOUNTER — Ambulatory Visit (INDEPENDENT_AMBULATORY_CARE_PROVIDER_SITE_OTHER): Payer: BLUE CROSS/BLUE SHIELD | Admitting: Obstetrics and Gynecology

## 2017-12-02 ENCOUNTER — Encounter: Payer: Self-pay | Admitting: Obstetrics and Gynecology

## 2017-12-02 VITALS — BP 96/68 | HR 102

## 2017-12-02 DIAGNOSIS — O1494 Unspecified pre-eclampsia, complicating childbirth: Secondary | ICD-10-CM

## 2017-12-02 NOTE — Progress Notes (Signed)
Pt present today for her 10 day bp check. Pt is not longer taking hydralazine HCL 10mg . Pt bp was 96/68 today. DJE stated that pt was clear to leave.

## 2017-12-12 DIAGNOSIS — J029 Acute pharyngitis, unspecified: Secondary | ICD-10-CM | POA: Diagnosis not present

## 2017-12-12 NOTE — Progress Notes (Signed)
 Subjective:  Patient ID: Erica Summers is a 22 y.o. female. HPI    Sore Throat    The problem location is  posterior.  Sore throat is described as  sore.  The level of severity  moderate.  Onset was  gradual.  This problem has existed for  2 days.  This problem  occurs constantly.  Since onset, the problem has been  gradually worsening.  The problem is  new.  Associated symptoms include fatigue, postnasal drip, rhinorrhea, headaches and chills.  Negative for abdominal pain, fever, swollen lymph nodes, trouble swallowing, arthralgia, voice change, neck stiffness, chest pain, oral lesion, heartburn/ regurgitation, cough, drooling, rash, ear pain, eye discharge, shortness of breath and sinus congestion.  Risk factors include  exposure to illness.  Negative for ingested substance, recent medical/dental procedure, trauma and history of GERD.          Comments    Sister had a sore throat.        Last edited by Rosina Rosa, NP on 12/12/2017  3:22 PM. (History)     ROS    Positive for: Constitutional, HENT   Negative for: Gastrointestinal, Neurological, Cardiovascular, Respiratory   Last edited by Rosina Rosa, NP on 12/12/2017  3:22 PM. (History)      Social History   Tobacco Use  Smoking Status Never Smoker  Smokeless Tobacco Never Used   Past Medical History:  Diagnosis Date  . Disease of thyroid  gland   . Hypertension    Past Surgical History:  Procedure Laterality Date  . CESAREAN SECTION     History reviewed. No pertinent family history. Objective: Physical Exam  Constitutional: She is oriented to person, place, and time. She appears well-developed and well-nourished. No distress.  HENT:  Head: Normocephalic and atraumatic.  Right Ear: Tympanic membrane and external ear normal.  Left Ear: Tympanic membrane and external ear normal.  Nose: No mucosal edema.  Mouth/Throat: Posterior oropharyngeal erythema present. No oropharyngeal exudate, posterior oropharyngeal edema  or tonsillar abscesses.  +PND noted  Eyes: EOM are normal. Pupils are equal, round, and reactive to light.  Neck: Normal range of motion. Neck supple.  Cardiovascular: Normal rate, regular rhythm and normal heart sounds. Exam reveals no gallop and no friction rub.  No murmur heard. Pulmonary/Chest: Effort normal and breath sounds normal. No respiratory distress. She has no wheezes. She has no rales. She exhibits no tenderness.  Lymphadenopathy:    She has no cervical adenopathy.  Neurological: She is alert and oriented to person, place, and time.  Skin: She is not diaphoretic.  Psychiatric: She has a normal mood and affect.     Assessment/Plan:                    Clinical presentation significant for viral infection.  Self-care and symptomatic treatment recommended in accordance with [facility] guidelines.  Follow-up immediately for worsening of symptoms, fever 101 or higher, or any new symptoms.   You should begin feeling better in 24-48 hours. If you were given an antibiotic, remember to continue taking your antibiotic for the full length of time it was prescribed to you. If you do not finish your antibiotic, the infection could return with worsening symptoms. It is recommended to take a probiotic for up to one month any time you are on an antibiotic.  Strict return precautions outlined with patient including signs and symptoms that would indicate need for emergent medical treatment. Patient verbalizing understanding.   Discussed with patient that [facility] does not have all of the resources provided to primary care or Urgent Care facilities and is limited to testing and care we are able to provide.

## 2017-12-14 ENCOUNTER — Ambulatory Visit (INDEPENDENT_AMBULATORY_CARE_PROVIDER_SITE_OTHER): Payer: BLUE CROSS/BLUE SHIELD | Admitting: Obstetrics and Gynecology

## 2017-12-14 ENCOUNTER — Encounter: Payer: Self-pay | Admitting: Obstetrics and Gynecology

## 2017-12-14 VITALS — BP 88/59 | HR 111 | Ht 67.0 in | Wt 130.5 lb

## 2017-12-14 DIAGNOSIS — O1494 Unspecified pre-eclampsia, complicating childbirth: Secondary | ICD-10-CM

## 2017-12-14 DIAGNOSIS — Z9889 Other specified postprocedural states: Secondary | ICD-10-CM

## 2017-12-14 NOTE — Progress Notes (Signed)
HPI:      [name] is a 22 y.o. G1P0101 who LMP was No LMP recorded.  Subjective:   She presents today approximately 5 weeks postpartum.  She is now off all antihypertensives.  She reports she is doing well.  She has not resumed intercourse.  She is pumping for breastmilk.  She desires birth control and once the IUD.  She reports no problems with her incision.    Hx: The following portions of the patient's history were reviewed and updated as appropriate:             She  has a past medical history of SVT (supraventricular tachycardia) (HCC). She does not have any pertinent problems on file. She  has a past surgical history that includes Wisdom tooth extraction (2016); Cardiac electrophysiology mapping and ablation (08/2017); and Cesarean section (N/A, 11/09/2017). Her family history is not on file. She  reports that she has never smoked. She has never used smokeless tobacco. She reports that she does not drink alcohol or use drugs. She has a current medication list which includes the following prescription(s): ferrous sulfate and levothyroxine. She has No Known Allergies.       Review of Systems:  Review of Systems  Constitutional: Denied constitutional symptoms, night sweats, recent illness, fatigue, fever, insomnia and weight loss.  Eyes: Denied eye symptoms, eye pain, photophobia, vision change and visual disturbance.  Ears/Nose/Throat/Neck: Denied ear, nose, throat or neck symptoms, hearing loss, nasal discharge, sinus congestion and sore throat.  Cardiovascular: Denied cardiovascular symptoms, arrhythmia, chest pain/pressure, edema, exercise intolerance, orthopnea and palpitations.  Respiratory: Denied pulmonary symptoms, asthma, pleuritic pain, productive sputum, cough, dyspnea and wheezing.  Gastrointestinal: Denied, gastro-esophageal reflux, melena, nausea and vomiting.  Genitourinary: Denied genitourinary symptoms including symptomatic vaginal discharge, pelvic  relaxation issues, and urinary complaints.  Musculoskeletal: Denied musculoskeletal symptoms, stiffness, swelling, muscle weakness and myalgia.  Dermatologic: Denied dermatology symptoms, rash and scar.  Neurologic: Denied neurology symptoms, dizziness, headache, neck pain and syncope.  Psychiatric: Denied psychiatric symptoms, anxiety and depression.  Endocrine: Denied endocrine symptoms including hot flashes and night sweats.   Meds:   Current Outpatient Medications on File Prior to Visit  Medication Sig Dispense Refill  . ferrous sulfate (FERROUSUL) 325 (65 FE) MG tablet Take 1 tablet (325 mg total) by mouth daily with breakfast. 60 tablet 1  . levothyroxine (SYNTHROID, LEVOTHROID) 25 MCG tablet Take 1 tablet (25 mcg total) by mouth daily before breakfast. 30 tablet 0   No current facility-administered medications on file prior to visit.     Objective:     Vitals:   12/14/17 0953  BP: (!) 88/59  Pulse: (!) 111               Abdomen: Soft.  Non-tender.  No masses.  No HSM.  Incision/s: Intact.  Healing well.  No erythema.  No drainage.    Pelvic examination   Pelvic:   Vulva: Normal appearance.  No lesions.  No abnormal scarring.    Vagina: No lesions or abnormalities noted.  Support: Normal pelvic support.  Urethra No masses tenderness or scarring.  Meatus Normal size without lesions or prolapse.  Cervix: Normal ectropion.  No lesions.  Anus: Normal exam.  No lesions.  Perineum: Normal exam.  No lesions.  Healed well.          Bimanual   Uterus: Normal size.  Non-tender.  Mobile.  AV.  Adnexae: No masses.  Non-tender to palpation.  Cul-de-sac: Negative  for abnormality.     Assessment:    G1P0101 Patient Active Problem List   Diagnosis Date Noted  . No prenatal care in current pregnancy 11/02/2017  . Pre-eclampsia affecting pregnancy, antepartum 11/02/2017  . Elevated blood pressure affecting pregnancy in third trimester, antepartum 11/02/2017  . Decreased  attention Span 09/02/2017  . Abdominal cramping 09/02/2017  . Irregular menses 09/02/2017  . Fatigue 06/08/2016  . Screen for sexually transmitted diseases 06/08/2016  . PSVT (paroxysmal supraventricular tachycardia) (HCC) 02/10/2016  . Palpitations 08/11/2015  . DUB (dysfunctional uterine bleeding) 02/18/2015     1. Post-operative state   2. Postpartum care and examination immediately after delivery   3. Pre-eclampsia, delivered     Patient doing well with an excellent recovery.  No further evidence of preeclampsia/pregnancy-induced hypertension.   Plan:            1.  Birth Control I discussed multiple birth control options and methods with the patient.  The risks and benefits of each were reviewed. IUD Literature on Mirena given.  Risks and benefits discussed.  She is considering IUD as an option for birth/cycle control. Patient desires Mirena.  Will present with next menstrual period or 2 weeks which ever comes first. Orders No orders of the defined types were placed in this encounter.   No orders of the defined types were placed in this encounter.     F/U  Return in about 2 weeks (around 12/28/2017) for She is to call at the start of next menses.  Elonda Huskyavid J. Loras Grieshop, M.D. 12/14/2017 10:11 AM

## 2017-12-20 ENCOUNTER — Encounter: Payer: Self-pay | Admitting: Obstetrics and Gynecology

## 2017-12-20 ENCOUNTER — Ambulatory Visit (INDEPENDENT_AMBULATORY_CARE_PROVIDER_SITE_OTHER): Payer: BLUE CROSS/BLUE SHIELD | Admitting: Obstetrics and Gynecology

## 2017-12-20 VITALS — BP 97/66 | HR 92 | Ht 67.0 in | Wt 129.8 lb

## 2017-12-20 DIAGNOSIS — Z30430 Encounter for insertion of intrauterine contraceptive device: Secondary | ICD-10-CM | POA: Diagnosis not present

## 2017-12-20 LAB — POCT URINE PREGNANCY: PREG TEST UR: NEGATIVE

## 2017-12-20 NOTE — Progress Notes (Signed)
HPI:      [name] is a 22 y.o. G1P0101 who LMP was Patient's last menstrual period was 12/17/2017.  Subjective:   She presents today for IUD insertion.      Hx: The following portions of the patient's history were reviewed and updated as appropriate:             She  has a past medical history of SVT (supraventricular tachycardia) (HCC). She does not have any pertinent problems on file. She  has a past surgical history that includes Wisdom tooth extraction (2016); Cardiac electrophysiology mapping and ablation (08/2017); and Cesarean section (N/A, 11/09/2017). Her family history is not on file. She  reports that she has never smoked. She has never used smokeless tobacco. She reports that she does not drink alcohol or use drugs. She has a current medication list which includes the following prescription(s): ferrous sulfate and levothyroxine. She has No Known Allergies.       Review of Systems:  Review of Systems  Constitutional: Denied constitutional symptoms, night sweats, recent illness, fatigue, fever, insomnia and weight loss.  Eyes: Denied eye symptoms, eye pain, photophobia, vision change and visual disturbance.  Ears/Nose/Throat/Neck: Denied ear, nose, throat or neck symptoms, hearing loss, nasal discharge, sinus congestion and sore throat.  Cardiovascular: Denied cardiovascular symptoms, arrhythmia, chest pain/pressure, edema, exercise intolerance, orthopnea and palpitations.  Respiratory: Denied pulmonary symptoms, asthma, pleuritic pain, productive sputum, cough, dyspnea and wheezing.  Gastrointestinal: Denied, gastro-esophageal reflux, melena, nausea and vomiting.  Genitourinary: Denied genitourinary symptoms including symptomatic vaginal discharge, pelvic relaxation issues, and urinary complaints.  Musculoskeletal: Denied musculoskeletal symptoms, stiffness, swelling, muscle weakness and myalgia.  Dermatologic: Denied dermatology symptoms, rash and scar.   Neurologic: Denied neurology symptoms, dizziness, headache, neck pain and syncope.  Psychiatric: Denied psychiatric symptoms, anxiety and depression.  Endocrine: Denied endocrine symptoms including hot flashes and night sweats.   Meds:   Current Outpatient Medications on File Prior to Visit  Medication Sig Dispense Refill  . ferrous sulfate (FERROUSUL) 325 (65 FE) MG tablet Take 1 tablet (325 mg total) by mouth daily with breakfast. 60 tablet 1  . levothyroxine (SYNTHROID, LEVOTHROID) 25 MCG tablet Take 1 tablet (25 mcg total) by mouth daily before breakfast. 30 tablet 0   No current facility-administered medications on file prior to visit.     Objective:     Vitals:   12/20/17 0926  BP: 97/66  Pulse: 92    Physical examination   Pelvic:   Vulva: Normal appearance.  No lesions.  Vagina: No lesions or abnormalities noted.  Support: Normal pelvic support.  Urethra No masses tenderness or scarring.  Meatus Normal size without lesions or prolapse.  Cervix: Normal appearance.  No lesions.  Anus: Normal exam.  No lesions.  Perineum: Normal exam.  No lesions.        Bimanual   Uterus: Normal size.  Non-tender.  Mobile.  AV.  Adnexae: No masses.  Non-tender to palpation.  Cul-de-sac: Negative for abnormality.   IUD Procedure Pt has read the booklet and signed the appropriate forms regarding the Mirena IUD.  All of her questions have been answered.   The cervix was cleansed with betadine solution.  After sounding the uterus and noting the position, the IUD was placed in the usual manner without problem.  The string was cut to the appropriate length.  The patient tolerated the procedure well.              Assessment:  [id number] Patient Active Problem List   Diagnosis Date Noted  . No prenatal care in current pregnancy 11/02/2017  . Pre-eclampsia affecting pregnancy, antepartum 11/02/2017  . Elevated blood pressure affecting pregnancy in third trimester, antepartum 11/02/2017   . Decreased attention Span 09/02/2017  . Abdominal cramping 09/02/2017  . Irregular menses 09/02/2017  . Fatigue 06/08/2016  . Screen for sexually transmitted diseases 06/08/2016  . PSVT (paroxysmal supraventricular tachycardia) (HCC) 02/10/2016  . Palpitations 08/11/2015  . DUB (dysfunctional uterine bleeding) 02/18/2015     1. Encounter for insertion of mirena IUD       Plan:             F/U  Return in about 1 month (around 01/17/2018).  Elonda Husky, M.D. 12/20/2017 9:45 AM

## 2017-12-28 ENCOUNTER — Ambulatory Visit: Payer: BLUE CROSS/BLUE SHIELD | Admitting: Obstetrics and Gynecology

## 2018-01-05 ENCOUNTER — Other Ambulatory Visit: Payer: Self-pay

## 2018-01-09 ENCOUNTER — Other Ambulatory Visit: Payer: Self-pay

## 2018-01-13 ENCOUNTER — Other Ambulatory Visit: Payer: Self-pay

## 2018-01-13 MED ORDER — LEVOTHYROXINE SODIUM 25 MCG PO TABS: 25.0000 ug | ORAL_TABLET | Freq: Every day | ORAL | 0 refills | Status: DC

## 2018-01-17 ENCOUNTER — Encounter: Payer: Self-pay | Admitting: Obstetrics and Gynecology

## 2018-01-17 ENCOUNTER — Ambulatory Visit (INDEPENDENT_AMBULATORY_CARE_PROVIDER_SITE_OTHER): Payer: BLUE CROSS/BLUE SHIELD | Admitting: Obstetrics and Gynecology

## 2018-01-17 VITALS — BP 114/73 | HR 105 | Ht 67.0 in | Wt 131.0 lb

## 2018-01-17 DIAGNOSIS — Z30431 Encounter for routine checking of intrauterine contraceptive device: Secondary | ICD-10-CM

## 2018-01-17 NOTE — Progress Notes (Signed)
Pt presents today for IUD insertion follow up. Pt states no issues or concerns.

## 2018-01-17 NOTE — Progress Notes (Signed)
HPI:      Erica Summers is a 22 y.o. G1P0101 who LMP was No LMP recorded.  Subjective:   She presents today for follow-up of her IUD.  She has stopped bleeding.  She denies cramping.  She has had intercourse without problem.  She is happy with her IUD.  She is no longer breast-feeding and has gone to bottlefeeding.  Her baby girl is 2 months old.    Hx: The following portions of the patient's history were reviewed and updated as appropriate:             She  has a past medical history of SVT (supraventricular tachycardia) (HCC). She does not have any pertinent problems on file. She  has a past surgical history that includes Wisdom tooth extraction (2016); Cardiac electrophysiology mapping and ablation (08/2017); and Cesarean section (N/A, 11/09/2017). Her family history is not on file. She  reports that she has never smoked. She has never used smokeless tobacco. She reports that she does not drink alcohol or use drugs. She has a current medication list which includes the following prescription(s): ferrous sulfate and levothyroxine. She has No Known Allergies.       Review of Systems:  Review of Systems  Constitutional: Denied constitutional symptoms, night sweats, recent illness, fatigue, fever, insomnia and weight loss.  Eyes: Denied eye symptoms, eye pain, photophobia, vision change and visual disturbance.  Ears/Nose/Throat/Neck: Denied ear, nose, throat or neck symptoms, hearing loss, nasal discharge, sinus congestion and sore throat.  Cardiovascular: Denied cardiovascular symptoms, arrhythmia, chest pain/pressure, edema, exercise intolerance, orthopnea and palpitations.  Respiratory: Denied pulmonary symptoms, asthma, pleuritic pain, productive sputum, cough, dyspnea and wheezing.  Gastrointestinal: Denied, gastro-esophageal reflux, melena, nausea and vomiting.  Genitourinary: Denied genitourinary symptoms including symptomatic vaginal discharge, pelvic relaxation issues, and  urinary complaints.  Musculoskeletal: Denied musculoskeletal symptoms, stiffness, swelling, muscle weakness and myalgia.  Dermatologic: Denied dermatology symptoms, rash and scar.  Neurologic: Denied neurology symptoms, dizziness, headache, neck pain and syncope.  Psychiatric: Denied psychiatric symptoms, anxiety and depression.  Endocrine: Denied endocrine symptoms including hot flashes and night sweats.   Meds:   Current Outpatient Medications on File Prior to Visit  Medication Sig Dispense Refill  . ferrous sulfate (FERROUSUL) 325 (65 FE) MG tablet Take 1 tablet (325 mg total) by mouth daily with breakfast. 60 tablet 1  . levothyroxine (SYNTHROID, LEVOTHROID) 25 MCG tablet Take 1 tablet (25 mcg total) by mouth daily before breakfast. 30 tablet 0   No current facility-administered medications on file prior to visit.     Objective:     Vitals:   01/17/18 1113  BP: 114/73  Pulse: (!) 105              Physical examination   Pelvic:   Vulva: Normal appearance.  No lesions.  Vagina: No lesions or abnormalities noted.  Support: Normal pelvic support.  Urethra No masses tenderness or scarring.  Meatus Normal size without lesions or prolapse.  Cervix: Normal appearance.  No lesions. IUD strings noted at cervical os.  Anus: Normal exam.  No lesions.  Perineum: Normal exam.  No lesions.        Bimanual   Uterus: Normal size.  Non-tender.  Mobile.  AV.  Adnexae: No masses.  Non-tender to palpation.  Cul-de-sac: Negative for abnormality.     Assessment:    G1P0101 Patient Active Problem List   Diagnosis Date Noted  . No prenatal care in current pregnancy 11/02/2017  . Pre-eclampsia  affecting pregnancy, antepartum 11/02/2017  . Elevated blood pressure affecting pregnancy in third trimester, antepartum 11/02/2017  . Decreased attention Span 09/02/2017  . Abdominal cramping 09/02/2017  . Irregular menses 09/02/2017  . Fatigue 06/08/2016  . Screen for sexually transmitted  diseases 06/08/2016  . PSVT (paroxysmal supraventricular tachycardia) (HCC) 02/10/2016  . Palpitations 08/11/2015  . DUB (dysfunctional uterine bleeding) 02/18/2015     1. Surveillance of previously prescribed intrauterine contraceptive device     Patient with no problems.   Plan:            1.  Follow-up for annual examination. Orders No orders of the defined types were placed in this encounter.   No orders of the defined types were placed in this encounter.     F/U  Return in about 1 year (around 01/18/2019) for Annual Physical.  Elonda Huskyavid J. Clayborne Divis, M.D. 01/17/2018 12:10 PM

## 2018-02-20 ENCOUNTER — Telehealth: Payer: Self-pay | Admitting: Obstetrics and Gynecology

## 2018-02-20 NOTE — Telephone Encounter (Signed)
Pt had a bad cramp last nite after IC. No pain this am.  It lasted about an hour. She did take 400 mg ibup which helped some. NO bleeding. NO uti sx. Reg bm. Due to deep pushing with IC this may have caused her pain. Encouraged pt to be gentle with IC.  Pt advised to take 800mg  ibup every 8h. If pain continues with IC or bleeding after she will need to be seen. Pt voices understanding.

## 2018-02-20 NOTE — Telephone Encounter (Signed)
The patient is complaining of sharp cramps worse than her cycle in her lower abdomen, and she feels this is due to her recent IUD placement in July.  She states this is better this morning, but wants to know if she can come in to be checked.  Dr. Logan BoresEvans is on vacation this week.  Please advise as to which provider and if she can be seen today or needs to be scheduled later this week.  Thanks

## 2018-02-28 ENCOUNTER — Telehealth: Payer: Self-pay | Admitting: Obstetrics and Gynecology

## 2018-02-28 NOTE — Telephone Encounter (Signed)
The patient called and stated she spoke with a nurse in regards to her having some IUD issues, The patient stated that she has now started to bleed and would like to come in and see a provider, and get a call back. Please advise.

## 2018-02-28 NOTE — Telephone Encounter (Signed)
Reassured pt that spotting is normal. If it became worse or included pain or fever, she should make an appt. Pt explained that she was feeling well and would call back if anything changed.

## 2018-07-21 DIAGNOSIS — M791 Myalgia, unspecified site: Secondary | ICD-10-CM | POA: Diagnosis not present

## 2018-07-21 DIAGNOSIS — J029 Acute pharyngitis, unspecified: Secondary | ICD-10-CM | POA: Diagnosis not present

## 2018-07-21 NOTE — Progress Notes (Signed)
 Subjective:  Patient ID: Erica Summers is a 23 y.o. female. No recent travel   Mouth or throat complaint  Location:  Posterior mouth Quality:  Sore and painful Onset Quality:  Sudden Severity:  Moderate Duration of current symptoms:  8 hours Timing:  Unchanged Chronicity:  New Ineffective treatments: advil . Worsened by:  Swallowing Response to treatments:  Moderate relief Associated symptoms: fatigue, headache and sore throat   Associated symptoms: no abdominal pain, no chest pain, no chills, no cough, no facial swelling, no trouble swallowing, no drooling, no muffled voice, no night sweats, no shortness of breath, no neck stiffness, no difficulty sticking tongue out, no congestion, no decreased appetite, no dental sensitivity to temperature, no ear pain, no fever, no neck pain, no rash, no rhinorrhea and no swollen glands   Risk factors:  Exposure to illness         Review of Systems  Constitutional: Positive for fatigue. Negative for activity change, appetite change, chills, decreased appetite, fever and night sweats.  HENT: Positive for sore throat. Negative for congestion, drooling, ear pain, facial swelling, rhinorrhea, trouble swallowing and voice change.   Respiratory: Negative for cough and shortness of breath.   Cardiovascular: Negative for chest pain.  Gastrointestinal: Negative for abdominal pain, diarrhea, nausea and vomiting.  Musculoskeletal: Negative for neck pain and neck stiffness.  Skin: Negative for rash.  Neurological: Positive for headaches. Negative for dizziness.    Social History   Tobacco Use  Smoking Status Never Smoker  Smokeless Tobacco Never Used   Past Medical History:  Diagnosis Date  . Disease of thyroid  gland   . Hypertension    Past Surgical History:  Procedure Laterality Date  . CESAREAN SECTION     No family history on file. Objective: Physical Exam  Constitutional: She is oriented to person, place, and time. She appears  well-developed and well-nourished. No distress.  HENT:  Head: Normocephalic and atraumatic.  Right Ear: Tympanic membrane, external ear and ear canal normal.  Left Ear: Tympanic membrane, external ear and ear canal normal.  Nose: Mucosal edema (edematous, erythematous) present. Right sinus exhibits no maxillary sinus tenderness and no frontal sinus tenderness. Left sinus exhibits no maxillary sinus tenderness and no frontal sinus tenderness.  Mouth/Throat: Uvula is midline and mucous membranes are normal. Posterior oropharyngeal erythema present. No oropharyngeal exudate.  Tonsils 2+ bilaterally. Cobblestoning noted to posterior pharynx. Copious clear PND  Eyes: Conjunctivae and EOM are normal. Pupils are equal, round, and reactive to light.  Neck: Normal range of motion. Neck supple.  Cardiovascular: Normal rate, regular rhythm, normal heart sounds and intact distal pulses.  Pulmonary/Chest: Effort normal and breath sounds normal. No respiratory distress. She has no wheezes.  Lymphadenopathy:    She has cervical adenopathy.  Neurological: She is alert and oriented to person, place, and time.  Skin: Skin is warm and dry. She is not diaphoretic.  Psychiatric: She has a normal mood and affect.     Assessment/Plan:    1. Acute pharyngitis, unspecified etiology  - Strep Rapid Antigen Detection POCT  2. Myalgia  - Influenza Digital Immunoassay POCT  Follow up immediately (Go to the ER) for difficulty swallowing or opening your mouth, drooling, shortness of breath, fever that does not respond to fever reducers or worsening symptoms.  Consider contagious until fever free for 24 hours without fever reducers (ex- tylenol , ibuprofen , aleve). Once fever is gone for 24 hours without fever reducers monitor for return of fever or change in symptoms (ear pain,  sinus pain or worsening cough) these are signs of secondary bacterial infections that need to be evaluated. F/u ASAP if these symptoms  occur  Pt agrees and verbalizes understanding of the above POC. The patient is aware that [facility] does not have all of the resources provided to primary care or Urgent Care facilities and will follow-up as directed if no improvement in symptoms within the designated time frame, worsening symptoms, or for new symptoms.   F/u in 3-4 days if no improvement

## 2019-01-18 ENCOUNTER — Encounter: Payer: BLUE CROSS/BLUE SHIELD | Admitting: Obstetrics and Gynecology

## 2020-02-20 IMAGING — US US OB LIMITED
1 series · 14 of 23 positions shown · non-contrast
Comparison: none

CLINICAL DATA: Bilateral foot swelling.  Spotting.

EXAM:
LIMITED OBSTETRIC ULTRASOUND

[Series 1: us ob limited · 0.23mm/px · 14 of 23 slices shown]
[im 1/23]
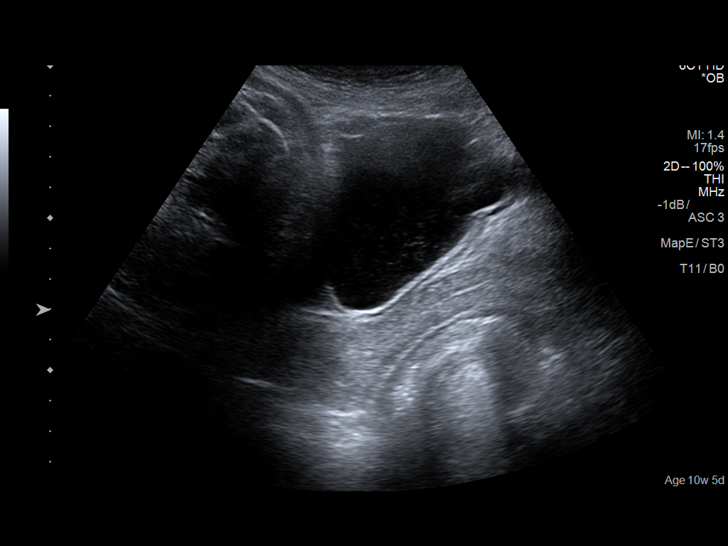
[im 3/23]
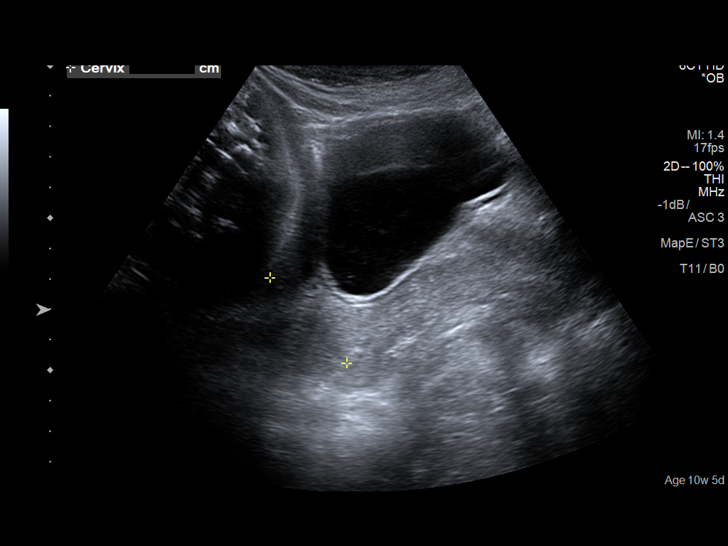
[im 5/23]
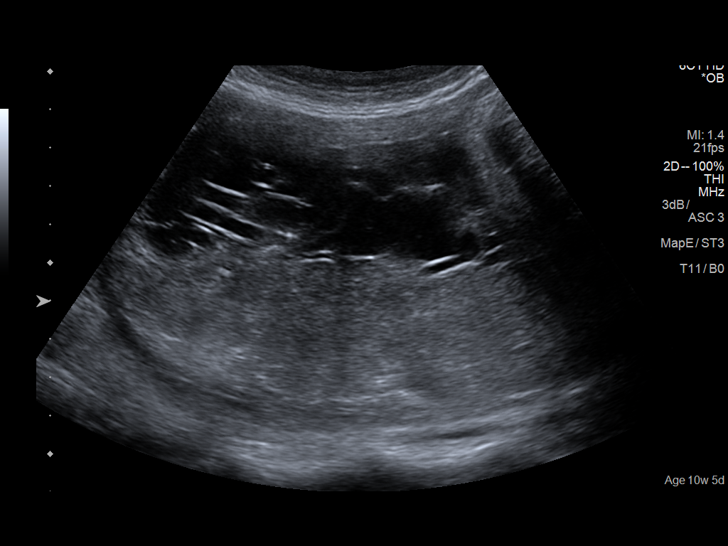
[im 6/23]
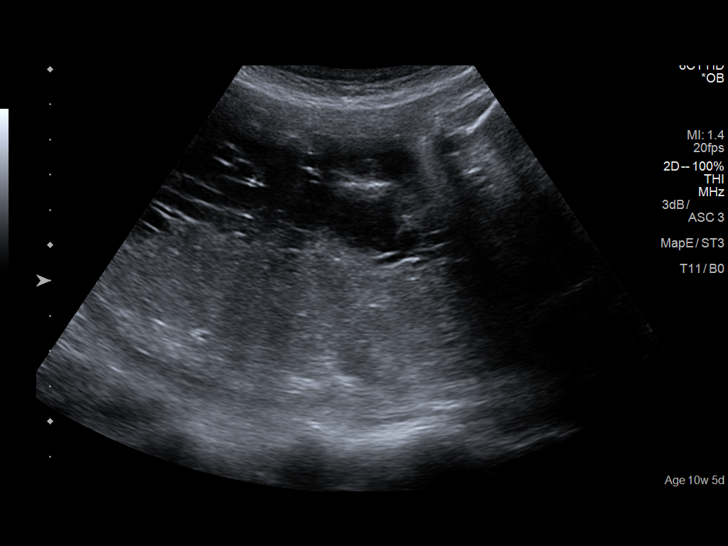
[im 8/23]
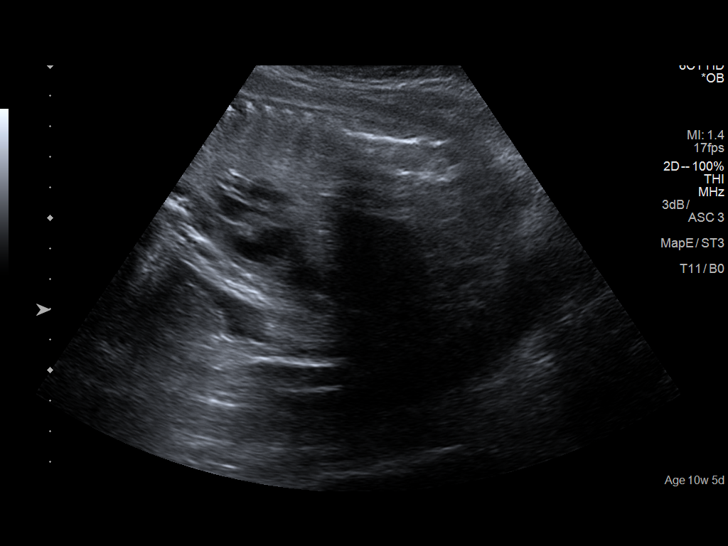
[im 10/23]
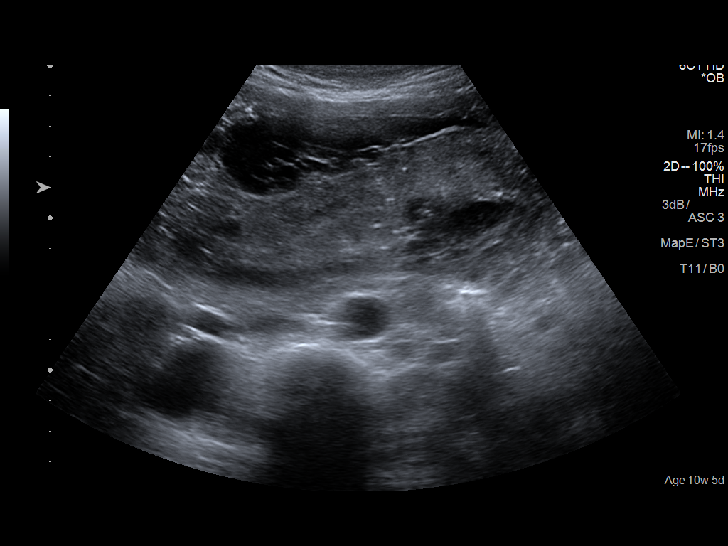
[im 11/23]
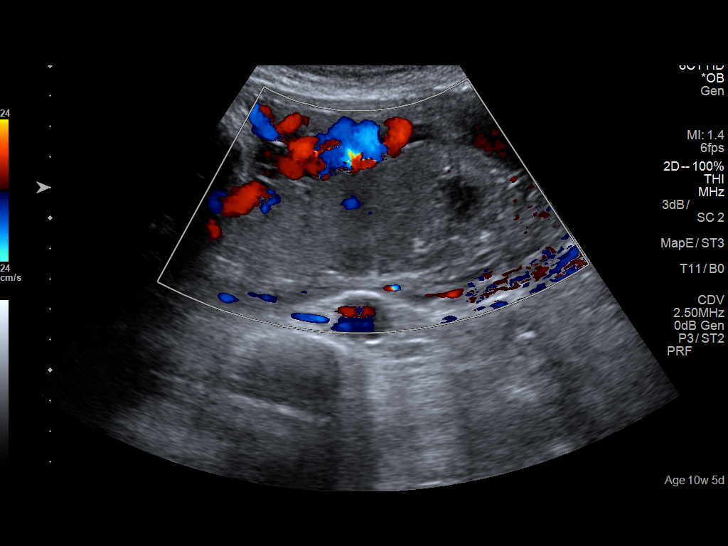
[im 13/23]
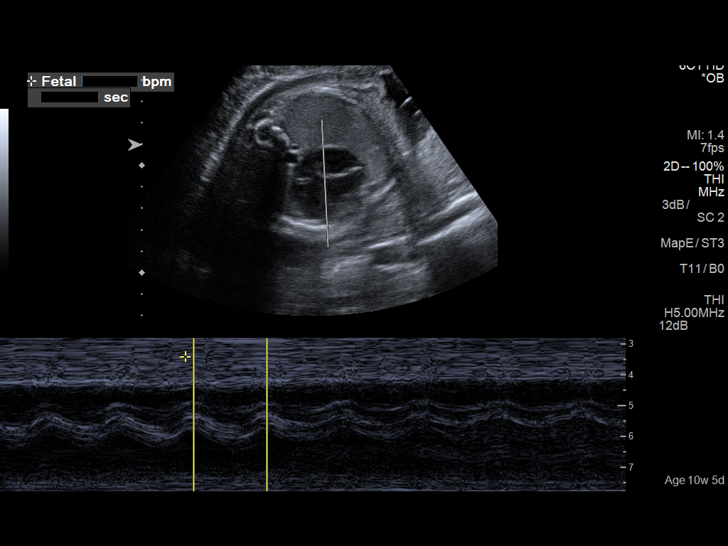
[im 14/23]
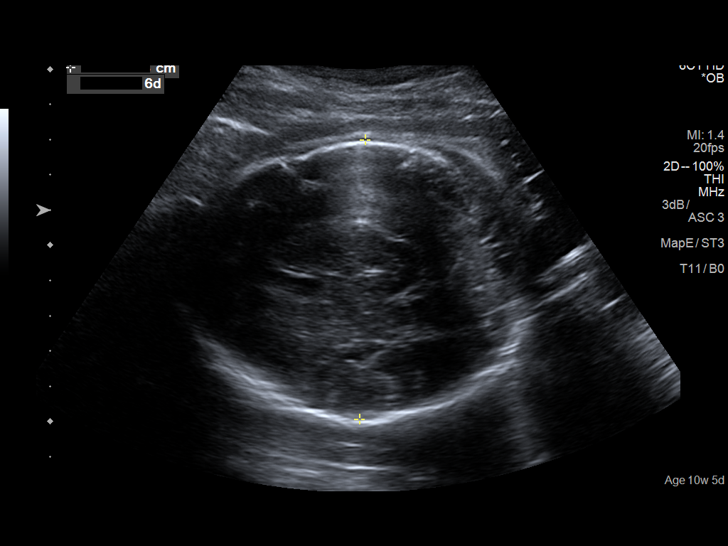
[im 16/23]
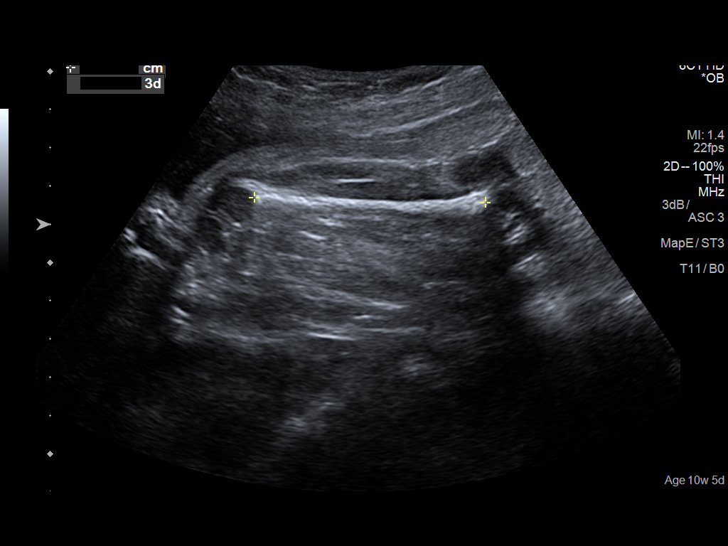
[im 18/23]
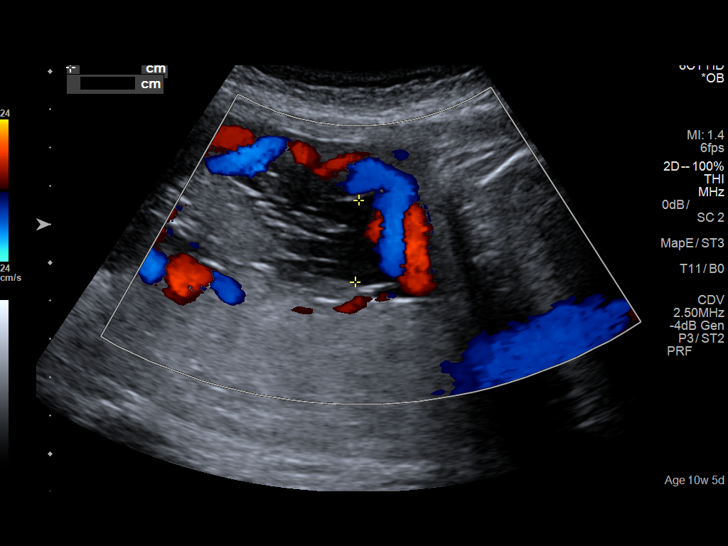
[im 19/23]
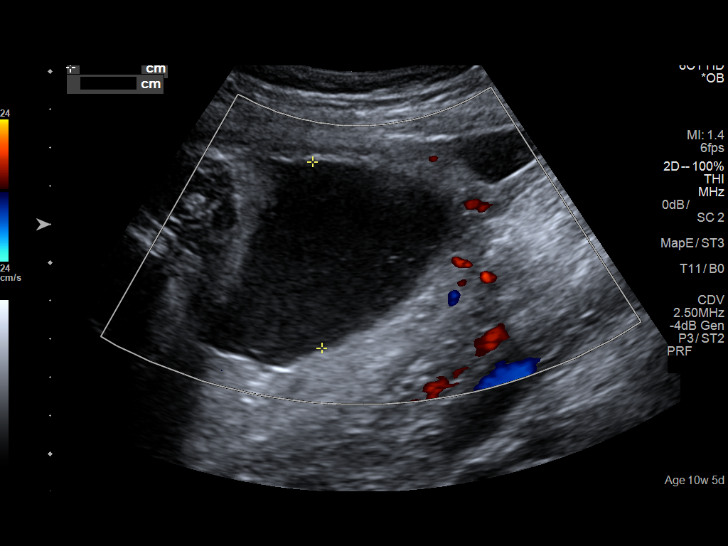
[im 21/23]
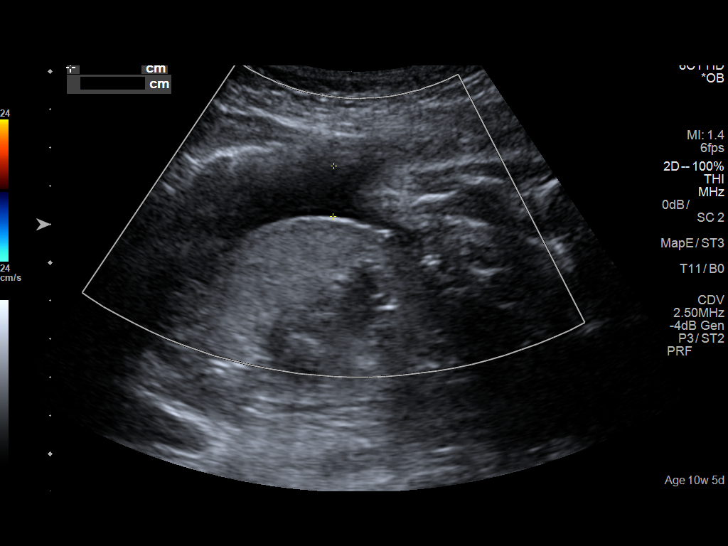
[im 23/23]
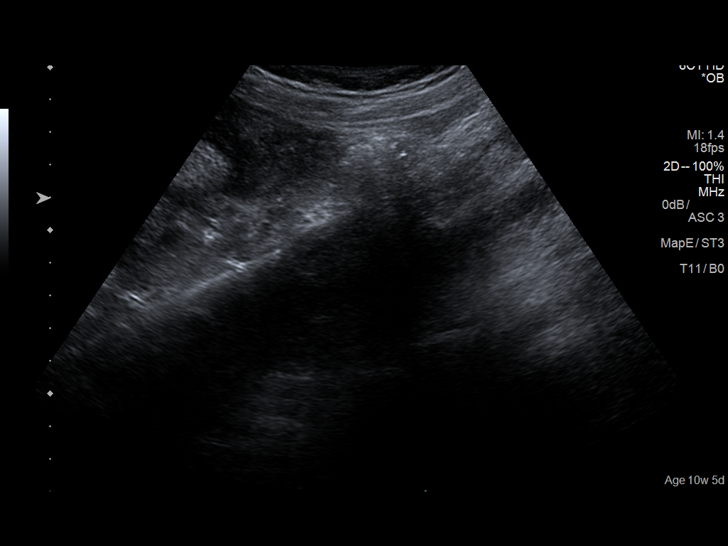

[14 of 23 positions shown; findings below may reference images not displayed]

FINDINGS: Number of Fetuses: 1

Heart Rate:  140 bpm

Movement: Yes

Presentation: Breech

Placental Location: Posterior

Previa: No

Amniotic Fluid (Subjective):  Within normal limits.

BPD: 7.9 cm 31 w  6 d

MATERNAL FINDINGS:

Cervix:  Appears closed.

Uterus/Adnexae: No abnormality visualized.
IMPRESSION: Single live IUP.

This exam is performed on an emergent basis and does not
comprehensively evaluate fetal size, dating, or anatomy; follow-up
complete OB US should be considered if further fetal assessment is
warranted.

## 2023-06-06 NOTE — Progress Notes (Signed)
 Subjective Patient ID: Erica Summers is a 27 y.o. female.  The patient is a 27 y.o. female who presents for evaluation of cough for 2 days. Patient reports associated symptoms of fatigue, fever, chills, runny nose, nasal congestion, cough, body aches, headache. Patient has been using OTC meds and that has not been helping. No SOB, nausea, vomiting or diarrhea noted.    History provided by:  Patient  Review of Systems  Constitutional:  Positive for chills, fatigue and fever.  HENT:  Positive for congestion, rhinorrhea and sore throat. Negative for ear discharge and ear pain.   Respiratory:  Positive for cough. Negative for chest tightness, shortness of breath and wheezing.   Gastrointestinal:  Negative for abdominal pain, diarrhea, nausea and vomiting.  Musculoskeletal:  Positive for arthralgias and myalgias.  Neurological:  Positive for headaches.   Patient History  Allergies: No Known Allergies  History reviewed. No pertinent past medical history. History reviewed. No pertinent surgical history. Social History   Socioeconomic History  . Marital status: Single    Spouse name: Not on Summers  . Number of children: Not on Summers  . Years of education: Not on Summers  . Highest education level: Not on Summers  Occupational History  . Not on Summers  Tobacco Use  . Smoking status: Never  . Smokeless tobacco: Never  Vaping Use  . Vaping status: Never Used  Substance and Sexual Activity  . Alcohol use: Yes  . Drug use: Never  . Sexual activity: Not Currently    Partners: Male    Birth control/protection: I.U.D.  Other Topics Concern  . Not on Summers  Social History Narrative  . Not on Summers   History reviewed. No pertinent family history. Current Outpatient Medications on Summers Prior to Visit  Medication Sig Dispense Refill  . levonorgestrel (Mirena) 20 MCG/24HR IUD 1 each by Intrauterine route.    . amoxicillin  (Amoxil ) 500 MG capsule 2 tabs PO Daily for 10 days (Patient not taking:  Reported on 06/06/2023) 20 capsule 0   No current facility-administered medications on Summers prior to visit.   Objective  Vitals:   06/06/23 1256  BP: 110/78  Pulse: (!) 101  Resp: 18  Temp: 37.3 C (99.1 F)  TempSrc: Oral  SpO2: 96%  Weight: 68.2 kg  Height: 5' 7  PainSc: 0-No pain        OBGYN/Pregnancy Status: Implant  Physical Exam  GENERAL APPEARANCE: afebrile, non-toxic and not acutely ill-appearing in NAD. Patient is speaking in full sentences with no increased work of breathing. EYES: EOM intact, PERRLA, with no conjunctival injection noted. NOSE: Nasal mucosa is without erythema with clear nasal discharge present bilaterally.  THROAT: No erythema, swelling, or tonsillary exudate noted bilaterally. No soft palate petechiae noted. Uvula is midline and oral mucosa is intact. NECK: Soft, supple with no cervical adenopathy noted. HEART: RRR, S1 and S2 present with no obvious murmur noted. No rubs, gallops or clicks noted. LUNGS: CTA bilaterally with no wheezing, ronchi, rales or crackles noted.   Results for orders placed or performed in visit on 06/06/23  POCT RAPID INFLUENZA MCKESSON  Component Result   Rapid Influenza A Ag Negative   Rapid Influenza B Ag Negative   Internal Quality Control Pass  POCT QuickVue Antigen Test  Component Result   Rapid Covid Negative   Internal Quality Control Pass   Procedures MDM:     1 Acute illness with systemic symptoms     Explanation of Medical Decision Making and  variances from expected care:    Meds as ordered. OTC meds for symptoms discussed with patient. See PCP soon. F/U as needed.    Unique ordered tests: Two     Review of any test results: Two     Assessment requiring historian other than patient: No     Independent visualization of image, tracing, or test: No     Discussion of management with another provider: No     Risk:: Moderate     Assessment/Plan Diagnoses and all orders for this visit:  Viral  upper respiratory tract infection  Cough -     POCT RAPID INFLUENZA MCKESSON -     POCT QuickVue Antigen Test -     albuterol 108 (90 Base) MCG/ACT inhaler; Inhale 2 puffs every 6 (six) hours if needed for wheezing. -     benzonatate (Tessalon) 200 MG capsule; Take 1 capsule (200 mg total) by mouth 3 (three) times a day if needed for cough for up to 7 days. Do not crush or chew. -     promethazine-dextromethorphan (Phenergan-DM) 6.25-15 MG/5ML syrup; Take 5 mL by mouth every 8 (eight) hours if needed for cough.   Disposition Status: Home  Progress note signed by Fairy Jenny, PA on 06/06/23 at  1:42 PM

## 2023-06-06 NOTE — Progress Notes (Signed)
 Erica Summers is a 27 y.o. female Presents with cough, headache, body aches, chills, fever and runny nose. Onset Saturday morning.

## 2023-08-02 DIAGNOSIS — Z118 Encounter for screening for other infectious and parasitic diseases: Secondary | ICD-10-CM | POA: Diagnosis not present

## 2023-08-02 DIAGNOSIS — Z124 Encounter for screening for malignant neoplasm of cervix: Secondary | ICD-10-CM | POA: Diagnosis not present

## 2023-08-02 DIAGNOSIS — Z1331 Encounter for screening for depression: Secondary | ICD-10-CM | POA: Diagnosis not present

## 2023-08-02 DIAGNOSIS — Z01419 Encounter for gynecological examination (general) (routine) without abnormal findings: Secondary | ICD-10-CM | POA: Diagnosis not present

## 2024-03-01 ENCOUNTER — Ambulatory Visit
Admission: EM | Admit: 2024-03-01 | Discharge: 2024-03-01 | Disposition: A | Discharge status: Home / Self Care | Attending: Emergency Medicine | Admitting: Emergency Medicine

## 2024-03-01 DIAGNOSIS — J039 Acute tonsillitis, unspecified: Secondary | ICD-10-CM

## 2024-03-01 DIAGNOSIS — J029 Acute pharyngitis, unspecified: Secondary | ICD-10-CM

## 2024-03-01 LAB — POCT RAPID STREP A (OFFICE): Rapid Strep A Screen: NEGATIVE

## 2024-03-01 MED ORDER — AMOXICILLIN 500 MG PO CAPS: 500.0000 mg | ORAL_CAPSULE | Freq: Two times a day (BID) | ORAL | 0 refills | Status: AC

## 2024-03-01 NOTE — ED Triage Notes (Signed)
 Patient to Urgent Care with complaints of sore throat with white patches/ headaches. Denies any known fevers.  Symptoms x2 days.   Taking mucinex/ ibuprofen .

## 2024-03-01 NOTE — Discharge Instructions (Addendum)
 Your strep test is negative today.  However, based on your exam today, take the amoxicillin  as directed.  Take Tylenol  or ibuprofen  as needed for fever or discomfort.  Follow-up with your primary care provider.

## 2024-03-01 NOTE — ED Provider Notes (Signed)
 [facility]    CSN: [id number] Arrival date & time: 03/01/24  1235      History   Chief Complaint Chief Complaint  Patient presents with   Sore Throat    HPI TAWNIE EHRESMAN is a 28 y.o. female.  Patient presents with 2-day history of sore throat, white patches on her tonsils, headache.  No fever, rash, cough, shortness of breath, vomiting, diarrhea.  Ibuprofen  taken this morning.  Patient has history of strep throat; last occurred in 2024.  The history is provided by the patient and medical records.    Past Medical History:  Diagnosis Date   SVT (supraventricular tachycardia)     Patient Active Problem List   Diagnosis Date Noted   No prenatal care in current pregnancy 11/02/2017   Pre-eclampsia affecting pregnancy, antepartum 11/02/2017   Elevated blood pressure affecting pregnancy in third trimester, antepartum 11/02/2017   Decreased attention Span 09/02/2017   Abdominal cramping 09/02/2017   Irregular menses 09/02/2017   Fatigue 06/08/2016   Screen for sexually transmitted diseases 06/08/2016   PSVT (paroxysmal supraventricular tachycardia) 02/10/2016   Palpitations 08/11/2015   DUB (dysfunctional uterine bleeding) 02/18/2015    Past Surgical History:  Procedure Laterality Date   CARDIAC ELECTROPHYSIOLOGY MAPPING AND ABLATION  08/2017   CESAREAN SECTION N/A 11/09/2017   Procedure: CESAREAN SECTION;  Surgeon: Janit Alm Agent, MD;  Location: [facility] ORS;  Service: Obstetrics;  Laterality: N/A;   WISDOM TOOTH EXTRACTION  2016    OB History     Gravida  1   Para  1   Term      Preterm  1   AB      Living  1      SAB      IAB      Ectopic      Multiple  0   Live Births  1            Home Medications    Prior to Admission medications   Medication Sig Start Date End Date Taking? Authorizing Provider  amoxicillin  (AMOXIL ) 500 MG capsule Take 1 capsule (500 mg total) by mouth 2 (two) times daily for 10 days. 03/01/24 03/11/24  Yes Corlis Burnard DEL, NP  ferrous sulfate  (FERROUSUL) 325 (65 FE) MG tablet Take 1 tablet (325 mg total) by mouth daily with breakfast. Patient not taking: Reported on 03/01/2024 11/13/17   Connell Davies, MD  levothyroxine  (SYNTHROID , LEVOTHROID) 25 MCG tablet Take 1 tablet (25 mcg total) by mouth daily before breakfast. Patient not taking: Reported on 03/01/2024 01/13/18   Connell Davies, MD    Family History History reviewed. No pertinent family history.  Social History Social History   Tobacco Use   Smoking status: Never   Smokeless tobacco: Never  Vaping Use   Vaping status: Every Day  Substance Use Topics   Alcohol use: No   Drug use: No     Allergies   Patient has no known allergies.   Review of Systems Review of Systems  Constitutional:  Negative for chills and fever.  HENT:  Positive for sore throat. Negative for ear pain.   Respiratory:  Negative for cough and shortness of breath.   Gastrointestinal:  Negative for diarrhea and vomiting.  Skin:  Negative for color change and rash.  Neurological:  Positive for headaches.     Physical Exam Triage Vital Signs ED Triage Vitals  Encounter Vitals Group     BP 03/01/24 1252 108/79  Girls Systolic BP Percentile --      Girls Diastolic BP Percentile --      Boys Systolic BP Percentile --      Boys Diastolic BP Percentile --      Pulse Rate 03/01/24 1252 86     Resp 03/01/24 1252 18     Temp 03/01/24 1252 98.2 F (36.8 C)     Temp src --      SpO2 03/01/24 1252 98 %     Weight --      Height --      Head Circumference --      Peak Flow --      Pain Score 03/01/24 1304 5     Pain Loc --      Pain Education --      Exclude from Growth Chart --    No data found.  Updated Vital Signs BP 108/79   Pulse 86   Temp 98.2 F (36.8 C)   Resp 18   SpO2 98%   Visual Acuity Right Eye Distance:   Left Eye Distance:   Bilateral Distance:    Right Eye Near:   Left Eye Near:    Bilateral Near:     Physical  Exam Constitutional:      General: She is not in acute distress. HENT:     Right Ear: Tympanic membrane normal.     Left Ear: Tympanic membrane normal.     Nose: Nose normal.     Mouth/Throat:     Mouth: Mucous membranes are moist.     Pharynx: Oropharyngeal exudate and posterior oropharyngeal erythema present.     Tonsils: 2+ on the right. 1+ on the left.     Comments: Posterior pharynx brightly erythematous, enlarged tonsils R>L, thick white exudate on right tonsil. Cardiovascular:     Rate and Rhythm: Normal rate and regular rhythm.     Heart sounds: Normal heart sounds.  Pulmonary:     Effort: Pulmonary effort is normal. No respiratory distress.     Breath sounds: Normal breath sounds.  Neurological:     Mental Status: She is alert.      UC Treatments / Results  Labs (all labs ordered are listed, but only abnormal results are displayed) Labs Reviewed  POCT RAPID STREP A (OFFICE)    EKG   Radiology No results found.  Procedures Procedures (including critical care time)  Medications Ordered in UC Medications - No data to display  Initial Impression / Assessment and Plan / UC Course  I have reviewed the triage vital signs and the nursing notes.  Pertinent labs & imaging results that were available during my care of the patient were reviewed by me and considered in my medical decision making (see chart for details).   Acute tonsillitis and pharyngitis.  Afebrile and vital signs are stable.  Rapid strep is negative.  However, based on patient's exam today and her history of strep throat, treating today with 10-day course of amoxicillin .  Education provided on tonsillitis and pharyngitis.  Instructed her to follow-up with her PCP.  ED precautions given.  She agrees to plan of care. Final Clinical Impressions(s) / UC Diagnoses   Final diagnoses:  Acute tonsillitis, unspecified etiology  Acute pharyngitis, unspecified etiology     Discharge Instructions       Your strep test is negative today.  However, based on your exam today, take the amoxicillin  as directed.  Take Tylenol  or ibuprofen  as needed for fever  or discomfort.  Follow-up with your primary care provider.     ED Prescriptions     Medication Sig Dispense Auth. Provider   amoxicillin  (AMOXIL ) 500 MG capsule Take 1 capsule (500 mg total) by mouth 2 (two) times daily for 10 days. 20 capsule Corlis Burnard DEL, NP      PDMP not reviewed this encounter.   Corlis Burnard DEL, NP 03/01/24 1331

## 2024-03-12 ENCOUNTER — Telehealth: Admitting: Physician Assistant

## 2024-03-12 DIAGNOSIS — J029 Acute pharyngitis, unspecified: Secondary | ICD-10-CM | POA: Diagnosis not present

## 2024-03-12 DIAGNOSIS — R051 Acute cough: Secondary | ICD-10-CM

## 2024-03-12 MED ORDER — PROMETHAZINE-DM 6.25-15 MG/5ML PO SYRP: 5.0000 mL | ORAL_SOLUTION | Freq: Four times a day (QID) | ORAL | 0 refills | Status: AC | PRN

## 2024-03-12 MED ORDER — NAPROXEN 500 MG PO TABS: 500.0000 mg | ORAL_TABLET | Freq: Two times a day (BID) | ORAL | 0 refills | Status: AC

## 2024-03-12 NOTE — Progress Notes (Signed)
 E-Visit for [employer] Virus / COVID Screening  Your current symptoms could be consistent with COVID.  Please complete a Covid test either at home or check with your local pharmacy to see if they provide testing.    You have tested positive for COVID-19, meaning that you were infected with the novel coronavirus and could give the virus to others.  Most people with COVID-19 have mild illness and can recover at home without medical care. Do not leave your home, except to get medical care. Do not visit public areas and do not go to places where you are unable to wear a mask. It is important that you stay home  to take care for yourself and to help protect other people in your home and community.      Isolation Instructions:   You are to isolate at home until you have been fever free for at least 24 hours without a fever-reducing medication, and symptoms have been steadily improving for 24 hours. At that time,  you can end isolation but need to mask for an additional 5 days.  If you must be around other household members who do not have symptoms, you need to make sure that both you and the family members are masking consistently with a high-quality mask.  If you note any worsening of symptoms despite treatment, please seek an in-person evaluation ASAP. If you note any significant shortness of breath or any chest pain, please seek ER evaluation. Please do not delay care!  Go to the nearest hospital ED for assessment if fever/cough/breathlessness are severe or illness seems like a threat to life.    The following symptoms may appear 2-14 days after exposure: Fever Cough Shortness of breath or difficulty breathing Chills Repeated shaking with chills Muscle pain Headache Sore throat New loss of taste or smell Fatigue Congestion or runny nose Nausea or vomiting Diarrhea  You can use medication such as I have prescribed Phenergan DM 6.25 mg/15 mg. You make take one teaspoon / 5 ml every 4-6 hours as  needed for cough and I have prescribed an anti-inflammatory - Naprosyn 500 mg. Take twice daily as needed for fever or body aches for 2 weeks  You may also take acetaminophen  (Tylenol ) as needed for fever.  HOME CARE Only take medications as instructed by your medical team. Drink plenty of fluids and get plenty of rest. A steam or ultrasonic humidifier can help if you have congestion.  GET HELP RIGHT AWAY IF YOU HAVE EMERGENCY WARNING SIGNS.  Call 911 or proceed to your closest emergency facility if: You develop worsening high fever. Trouble breathing Bluish lips or face Persistent pain or pressure in the chest New confusion Inability to wake or stay awake You cough up blood. Your symptoms become more severe Inability to hold down food or fluids  This list is not all possible symptoms. Contact your medical provider for any symptoms that are severe or concerning to you.   Your e-visit answers were reviewed by a board certified advanced clinical practitioner to complete your personal care plan.  Depending on the condition, your plan could have included both over the counter or prescription medications.  If there is a problem, please reply once you have received a response from your provider.  Your safety is important to us .  If you have drug allergies check your prescription carefully.    You can use MyChart to ask questions about today's visit, request a non-urgent call back, or ask for a work  or school excuse for 24 hours related to this e-Visit. If it has been greater than 24 hours you will need to follow up with your provider or enter a new e-Visit to address those concerns. You will get an e-mail in the next two days asking about your experience.  I hope that your e-visit has been valuable and will speed your recovery. Thank you for using e-visits.    I have spent 5 minutes in review of e-visit questionnaire, review and updating patient chart, medical decision making and response  to patient.   Delon CHRISTELLA Dickinson, PA-C
